# Patient Record
Sex: Female | Born: 1960 | Race: White | Hispanic: No | Marital: Married | Smoking: Former smoker
Health system: Southern US, Community
[De-identification: ages and names within clinical notes are randomized; demographics above are authoritative.]

## PROBLEM LIST (undated history)

## (undated) DIAGNOSIS — R519 Headache, unspecified: Secondary | ICD-10-CM

## (undated) DIAGNOSIS — R51 Headache: Secondary | ICD-10-CM

## (undated) DIAGNOSIS — M1990 Unspecified osteoarthritis, unspecified site: Secondary | ICD-10-CM

## (undated) DIAGNOSIS — J45909 Unspecified asthma, uncomplicated: Secondary | ICD-10-CM

## (undated) DIAGNOSIS — G709 Myoneural disorder, unspecified: Secondary | ICD-10-CM

## (undated) DIAGNOSIS — Z8619 Personal history of other infectious and parasitic diseases: Secondary | ICD-10-CM

## (undated) HISTORY — DX: Unspecified osteoarthritis, unspecified site: M19.90

## (undated) HISTORY — DX: Headache: R51

## (undated) HISTORY — DX: Myoneural disorder, unspecified: G70.9

## (undated) HISTORY — DX: Unspecified asthma, uncomplicated: J45.909

## (undated) HISTORY — DX: Personal history of other infectious and parasitic diseases: Z86.19

## (undated) HISTORY — DX: Headache, unspecified: R51.9

---

## 2003-12-22 ENCOUNTER — Encounter: Admission: RE | Admit: 2003-12-22 | Discharge: 2003-12-22 | Payer: Self-pay | Admitting: Internal Medicine

## 2004-03-23 ENCOUNTER — Other Ambulatory Visit: Admission: RE | Admit: 2004-03-23 | Discharge: 2004-03-23 | Payer: Self-pay | Admitting: Internal Medicine

## 2011-10-17 ENCOUNTER — Other Ambulatory Visit: Payer: Self-pay | Admitting: Obstetrics and Gynecology

## 2011-10-17 DIAGNOSIS — R928 Other abnormal and inconclusive findings on diagnostic imaging of breast: Secondary | ICD-10-CM

## 2011-10-25 ENCOUNTER — Ambulatory Visit
Admission: RE | Admit: 2011-10-25 | Discharge: 2011-10-25 | Disposition: A | Discharge status: Home / Self Care | Payer: BC Managed Care – PPO | Source: Ambulatory Visit | Attending: Obstetrics and Gynecology | Admitting: Obstetrics and Gynecology

## 2011-10-25 DIAGNOSIS — R928 Other abnormal and inconclusive findings on diagnostic imaging of breast: Secondary | ICD-10-CM

## 2012-09-28 DIAGNOSIS — J4520 Mild intermittent asthma, uncomplicated: Secondary | ICD-10-CM | POA: Insufficient documentation

## 2012-09-28 DIAGNOSIS — F17200 Nicotine dependence, unspecified, uncomplicated: Secondary | ICD-10-CM | POA: Insufficient documentation

## 2014-11-23 LAB — HM MAMMOGRAPHY

## 2014-11-23 LAB — HM COLONOSCOPY

## 2014-11-23 LAB — HM PAP SMEAR: HM Pap smear: NORMAL

## 2015-01-23 LAB — HM COLONOSCOPY

## 2015-04-06 ENCOUNTER — Encounter (HOSPITAL_COMMUNITY): Payer: Self-pay | Admitting: Emergency Medicine

## 2015-04-06 ENCOUNTER — Emergency Department (HOSPITAL_COMMUNITY)
Admission: EM | Admit: 2015-04-06 | Discharge: 2015-04-07 | Disposition: A | Discharge status: Home / Self Care | Payer: BLUE CROSS/BLUE SHIELD | Attending: Physician Assistant | Admitting: Physician Assistant

## 2015-04-06 ENCOUNTER — Emergency Department (HOSPITAL_COMMUNITY): Payer: BLUE CROSS/BLUE SHIELD

## 2015-04-06 DIAGNOSIS — S8991XA Unspecified injury of right lower leg, initial encounter: Secondary | ICD-10-CM | POA: Diagnosis not present

## 2015-04-06 DIAGNOSIS — Y9389 Activity, other specified: Secondary | ICD-10-CM | POA: Diagnosis not present

## 2015-04-06 DIAGNOSIS — S199XXA Unspecified injury of neck, initial encounter: Secondary | ICD-10-CM | POA: Insufficient documentation

## 2015-04-06 DIAGNOSIS — S80812A Abrasion, left lower leg, initial encounter: Secondary | ICD-10-CM | POA: Diagnosis not present

## 2015-04-06 DIAGNOSIS — Y92410 Unspecified street and highway as the place of occurrence of the external cause: Secondary | ICD-10-CM | POA: Diagnosis not present

## 2015-04-06 DIAGNOSIS — Z79899 Other long term (current) drug therapy: Secondary | ICD-10-CM | POA: Insufficient documentation

## 2015-04-06 DIAGNOSIS — Y998 Other external cause status: Secondary | ICD-10-CM | POA: Diagnosis not present

## 2015-04-06 DIAGNOSIS — Z87891 Personal history of nicotine dependence: Secondary | ICD-10-CM | POA: Insufficient documentation
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
## 2015-04-07 ENCOUNTER — Emergency Department (HOSPITAL_COMMUNITY): Payer: BLUE CROSS/BLUE SHIELD

## 2015-04-07 DIAGNOSIS — S199XXA Unspecified injury of neck, initial encounter: Secondary | ICD-10-CM | POA: Diagnosis not present

## 2015-04-07 MED ORDER — TETANUS-DIPHTH-ACELL PERTUSSIS 5-2.5-18.5 LF-MCG/0.5 IM SUSP
0.5000 mL | Freq: Once | INTRAMUSCULAR | Status: DC
  Filled 2015-04-07: qty 0.5

## 2015-04-07 MED ORDER — IBUPROFEN 800 MG PO TABS: 800.0000 mg | ORAL_TABLET | Freq: Three times a day (TID) | ORAL | Status: DC

## 2015-04-07 MED ORDER — CYCLOBENZAPRINE HCL 10 MG PO TABS: 10.0000 mg | ORAL_TABLET | Freq: Two times a day (BID) | ORAL | Status: DC | PRN

## 2015-04-07 NOTE — ED Notes (Signed)
Pt going back to xray

## 2015-04-07 NOTE — Discharge Instructions (Signed)

## 2015-04-14 ENCOUNTER — Telehealth: Payer: Self-pay | Admitting: General Practice

## 2015-04-14 NOTE — Telephone Encounter (Signed)
Caller name:Quintin, Ayasha Egusquiza Relation to [id number] Call back [phone] Pharmacy:  Reason for call:  Patient would like to establish care when PCP relocates to [facility]. Please advise

## 2015-04-15 NOTE — Telephone Encounter (Signed)
Called patient back, no voicemail setup

## 2015-04-15 NOTE — Telephone Encounter (Signed)
New patient appointment scheduled for 05/05/2015

## 2015-04-15 NOTE — Telephone Encounter (Signed)
Ok to establish 

## 2015-04-20 ENCOUNTER — Encounter: Payer: Self-pay | Admitting: Family Medicine

## 2015-04-20 ENCOUNTER — Ambulatory Visit (HOSPITAL_BASED_OUTPATIENT_CLINIC_OR_DEPARTMENT_OTHER)
Admission: RE | Admit: 2015-04-20 | Discharge: 2015-04-20 | Disposition: A | Discharge status: Home / Self Care | Payer: BLUE CROSS/BLUE SHIELD | Source: Ambulatory Visit | Attending: Family Medicine | Admitting: Family Medicine

## 2015-04-20 ENCOUNTER — Ambulatory Visit (INDEPENDENT_AMBULATORY_CARE_PROVIDER_SITE_OTHER): Payer: BLUE CROSS/BLUE SHIELD | Admitting: Family Medicine

## 2015-04-20 VITALS — BP 124/84 | HR 77 | Temp 98.0°F | Resp 16 | Ht 67.5 in | Wt 263.0 lb

## 2015-04-20 DIAGNOSIS — M79661 Pain in right lower leg: Secondary | ICD-10-CM

## 2015-04-20 DIAGNOSIS — M79604 Pain in right leg: Secondary | ICD-10-CM | POA: Insufficient documentation

## 2015-04-20 DIAGNOSIS — M25521 Pain in right elbow: Secondary | ICD-10-CM

## 2015-04-20 DIAGNOSIS — R2241 Localized swelling, mass and lump, right lower limb: Secondary | ICD-10-CM | POA: Insufficient documentation

## 2015-04-20 DIAGNOSIS — M6248 Contracture of muscle, other site: Secondary | ICD-10-CM

## 2015-04-20 DIAGNOSIS — M62838 Other muscle spasm: Secondary | ICD-10-CM

## 2015-04-20 DIAGNOSIS — M7989 Other specified soft tissue disorders: Secondary | ICD-10-CM

## 2015-04-20 NOTE — Progress Notes (Signed)
Pre visit review using our clinic review tool, if applicable. No additional management support is needed unless otherwise documented below in the visit note. 

## 2015-04-20 NOTE — Patient Instructions (Signed)
Follow up as scheduled Go downstairs and get your ultrasound done at 1:30 Start the Mobic once daily- take w/ food for inflammation Use the flexeril at night for muscle spasm, will cause drowsiness Heat the neck and shoulders Ice the elbow Heat the lower leg Call with any questions or concerns Hang in there!!!

## 2015-04-20 NOTE — Assessment & Plan Note (Signed)
New.  Start scheduled NSAIDs, flexeril prn.  Heat.  Discussed need for stretching and/or gentle massage.  Will follow.

## 2015-04-20 NOTE — Assessment & Plan Note (Signed)
New.  Get [place] to r/o DVT vs hematoma (more likely).  Start scheduled NSAIDs, heat, gentle massage.  If doppler is + for DVT, plan is to start Xarelto.  Reviewed supportive care and red flags that should prompt return.  Pt expressed understanding and is in agreement w/ plan.
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
## 2015-04-23 ENCOUNTER — Telehealth: Payer: Self-pay | Admitting: Family Medicine

## 2015-04-23 MED ORDER — MELOXICAM 15 MG PO TABS: 15.0000 mg | ORAL_TABLET | Freq: Every day | ORAL | Status: DC

## 2015-04-23 NOTE — Telephone Encounter (Signed)
Medication filled to pharmacy as requested.   

## 2015-04-23 NOTE — Telephone Encounter (Signed)
Caller name: Self   Can be reached: [phone]  Pharmacy:  [id number] - [place], [place] AT [facility] OF [address] [phone] (Phone) [phone] (Fax)         Reason for call: States Dr.Tabori was to send her a rx to pharmacy for Mobic but it was not sent

## 2015-05-04 ENCOUNTER — Telehealth: Payer: Self-pay | Admitting: Behavioral Health

## 2015-05-04 ENCOUNTER — Encounter: Payer: Self-pay | Admitting: Behavioral Health

## 2015-05-04 NOTE — Telephone Encounter (Signed)
Pre-Visit Call completed with patient and chart updated.   Pre-Visit Info documented in Specialty Comments under [employer].    

## 2015-05-05 ENCOUNTER — Encounter: Payer: Self-pay | Admitting: Family Medicine

## 2015-05-05 ENCOUNTER — Ambulatory Visit (INDEPENDENT_AMBULATORY_CARE_PROVIDER_SITE_OTHER): Payer: BLUE CROSS/BLUE SHIELD | Admitting: Family Medicine

## 2015-05-05 ENCOUNTER — Encounter (INDEPENDENT_AMBULATORY_CARE_PROVIDER_SITE_OTHER): Payer: Self-pay

## 2015-05-05 VITALS — BP 112/51 | HR 75 | Temp 98.2°F | Ht 68.0 in | Wt 266.6 lb

## 2015-05-05 DIAGNOSIS — Z0000 Encounter for general adult medical examination without abnormal findings: Secondary | ICD-10-CM | POA: Diagnosis not present

## 2015-05-05 DIAGNOSIS — E669 Obesity, unspecified: Secondary | ICD-10-CM | POA: Insufficient documentation

## 2015-05-05 DIAGNOSIS — M179 Osteoarthritis of knee, unspecified: Secondary | ICD-10-CM | POA: Insufficient documentation

## 2015-05-05 DIAGNOSIS — M1710 Unilateral primary osteoarthritis, unspecified knee: Secondary | ICD-10-CM | POA: Insufficient documentation

## 2015-05-05 DIAGNOSIS — M170 Bilateral primary osteoarthritis of knee: Secondary | ICD-10-CM

## 2015-05-05 HISTORY — DX: Encounter for general adult medical examination without abnormal findings: Z00.00

## 2015-05-05 NOTE — Assessment & Plan Note (Signed)
Pt's PE WNL w/ exception of obesity.  UTD on GYN, colonoscopy.  UTD on flu shot.  Check labs.  Anticipatory guidance provided.

## 2015-05-05 NOTE — Progress Notes (Signed)
Pre visit review using our clinic review tool, if applicable. No additional management support is needed unless otherwise documented below in the visit note. 

## 2015-05-05 NOTE — Progress Notes (Signed)
   Subjective:    Patient ID: Patricia Adams, female    DOB: Aug 23, 1960, 55 y.o.   MRN: [id number]  HPI CPE- UTD on GYN w/ Dr Philis Pique, UTD on colonoscopy w/ Dr Collene Mares.   Review of Systems Patient reports no vision/ hearing changes, adenopathy,fever, weight change,  persistant/recurrent hoarseness , swallowing issues, chest pain, palpitations, edema, persistant/recurrent cough, hemoptysis, dyspnea (rest/exertional/paroxysmal nocturnal), gastrointestinal bleeding (melena, rectal bleeding), abdominal pain, significant heartburn, bowel changes, GU symptoms (dysuria, hematuria, incontinence), Gyn symptoms (abnormal  bleeding, pain),  syncope, focal weakness, memory loss, numbness & tingling, skin/hair/nail changes, abnormal bruising or bleeding, anxiety, or depression.   Bilateral knee pain- pt requesting ortho referral, tweaked knee while moving daughter into apartment and required knee injxn in [place], [place].    Objective:   Physical Exam General Appearance:    Alert, cooperative, no distress, appears stated age, obese  Head:    Normocephalic, without obvious abnormality, atraumatic  Eyes:    PERRL, conjunctiva/corneas clear, EOM's intact, fundi    benign, both eyes  Ears:    Normal TM's and external ear canals, both ears  Nose:   Nares normal, septum midline, mucosa normal, no drainage    or sinus tenderness  Throat:   Lips, mucosa, and tongue normal; teeth and gums normal  Neck:   Supple, symmetrical, trachea midline, no adenopathy;    Thyroid: no enlargement/tenderness/nodules  Back:     Symmetric, no curvature, ROM normal, no CVA tenderness  Lungs:     Clear to auscultation bilaterally, respirations unlabored  Chest Wall:    No tenderness or deformity   Heart:    Regular rate and rhythm, S1 and S2 normal, no murmur, rub   or gallop  Breast Exam:    Deferred to GYN  Abdomen:     Soft, non-tender, bowel sounds active all four quadrants,    no masses, no organomegaly  Genitalia:    Deferred  to GYN  Rectal:    Extremities:   Extremities normal, atraumatic, no cyanosis or edema  Pulses:   2+ and symmetric all extremities  Skin:   Skin color, texture, turgor normal, no rashes or lesions  Lymph nodes:   Cervical, supraclavicular, and axillary nodes normal  Neurologic:   CNII-XII intact, normal strength, sensation and reflexes    throughout          Assessment & Plan:

## 2015-05-05 NOTE — Assessment & Plan Note (Signed)
Refer to ortho at pt's request 

## 2015-05-05 NOTE — Patient Instructions (Signed)
Follow up in 1 year or as needed We'll notify you of your lab results and make any changes if needed Try and make healthy food choices and get regular exercise Call with any questions or concerns If you want to join [place] at the new [place] office, any scheduled appointments will automatically transfer and we will see you at [address] [place] [address], [place] [address] ([place]) Happy New Year!!

## 2015-05-05 NOTE — Assessment & Plan Note (Signed)
Stressed need for healthy diet and regular exercise.  Check labs to risk stratify.  Will continue to follow.

## 2015-05-06 LAB — LIPID PANEL
Cholesterol: 191 mg/dL (ref 0–200)
HDL: 60.4 mg/dL (ref >39.00)
NonHDL: 130.26
Total CHOL/HDL Ratio: 3
Triglycerides: 212 mg/dL — ABNORMAL HIGH (ref 0.0–149.0)
VLDL: 42.4 mg/dL — ABNORMAL HIGH (ref 0.0–40.0)

## 2015-05-06 LAB — HEPATIC FUNCTION PANEL
ALT: 17 U/L (ref 0–35)
AST: 16 U/L (ref 0–37)
Albumin: 4.3 g/dL (ref 3.5–5.2)
Alkaline Phosphatase: 98 U/L (ref 39–117)
Bilirubin, Direct: 0.1 mg/dL (ref 0.0–0.3)
Total Bilirubin: 0.3 mg/dL (ref 0.2–1.2)
Total Protein: 7.2 g/dL (ref 6.0–8.3)

## 2015-05-06 LAB — BASIC METABOLIC PANEL
BUN: 16 mg/dL (ref 6–23)
CO2: 29 mEq/L (ref 19–32)
Calcium: 9.9 mg/dL (ref 8.4–10.5)
Chloride: 105 mEq/L (ref 96–112)
Creatinine, Ser: 1.05 mg/dL (ref 0.40–1.20)
GFR: 58.18 mL/min — ABNORMAL LOW (ref >60.00)
Glucose, Bld: 90 mg/dL (ref 70–99)
Potassium: 4.8 mEq/L (ref 3.5–5.1)
Sodium: 141 mEq/L (ref 135–145)

## 2015-05-06 LAB — CBC WITH DIFFERENTIAL/PLATELET
Basophils Absolute: 0 10*3/uL (ref 0.0–0.1)
Basophils Relative: 0.7 % (ref 0.0–3.0)
Eosinophils Absolute: 0.1 10*3/uL (ref 0.0–0.7)
Eosinophils Relative: 1.5 % (ref 0.0–5.0)
HCT: 37.2 % (ref 36.0–46.0)
Hemoglobin: 12.2 g/dL (ref 12.0–15.0)
Lymphocytes Relative: 29.1 % (ref 12.0–46.0)
Lymphs Abs: 1.8 10*3/uL (ref 0.7–4.0)
MCHC: 32.8 g/dL (ref 30.0–36.0)
MCV: 85.8 fl (ref 78.0–100.0)
Monocytes Absolute: 0.7 10*3/uL (ref 0.1–1.0)
Monocytes Relative: 11.7 % (ref 3.0–12.0)
Neutro Abs: 3.4 10*3/uL (ref 1.4–7.7)
Neutrophils Relative %: 57 % (ref 43.0–77.0)
Platelets: 300 10*3/uL (ref 150.0–400.0)
RBC: 4.34 Mil/uL (ref 3.87–5.11)
RDW: 15.3 % (ref 11.5–15.5)
WBC: 6 10*3/uL (ref 4.0–10.5)

## 2015-05-06 LAB — VITAMIN D 25 HYDROXY (VIT D DEFICIENCY, FRACTURES): VITD: 25.73 ng/mL — ABNORMAL LOW (ref 30.00–100.00)

## 2015-05-06 LAB — LDL CHOLESTEROL, DIRECT: Direct LDL: 49 mg/dL

## 2015-05-06 LAB — TSH: TSH: 2.75 u[IU]/mL (ref 0.35–4.50)

## 2015-05-06 LAB — HEMOGLOBIN A1C: Hgb A1c MFr Bld: 5.7 % (ref 4.6–6.5)

## 2015-05-11 ENCOUNTER — Other Ambulatory Visit: Payer: Self-pay | Admitting: Family Medicine

## 2015-05-11 MED ORDER — VITAMIN D (ERGOCALCIFEROL) 1.25 MG (50000 UNIT) PO CAPS: 50000.0000 [IU] | ORAL_CAPSULE | ORAL | Status: DC

## 2015-06-15 ENCOUNTER — Telehealth: Payer: Self-pay | Admitting: Family Medicine
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
## 2015-06-15 NOTE — Telephone Encounter (Signed)
[place] for PT referral to [facility] location- but we need to know if it is her knee or elbow or back that is causing her current pain so we can refer for appropriate dx

## 2015-06-16 ENCOUNTER — Other Ambulatory Visit: Payer: Self-pay | Admitting: Family Medicine

## 2015-06-16 DIAGNOSIS — M25521 Pain in right elbow: Secondary | ICD-10-CM
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
## 2015-06-29 ENCOUNTER — Ambulatory Visit: Payer: BLUE CROSS/BLUE SHIELD | Attending: Family Medicine | Admitting: Physical Therapy

## 2015-06-29 DIAGNOSIS — M7701 Medial epicondylitis, right elbow: Secondary | ICD-10-CM | POA: Diagnosis present

## 2015-06-29 DIAGNOSIS — M25521 Pain in right elbow: Secondary | ICD-10-CM | POA: Diagnosis not present

## 2015-06-29 DIAGNOSIS — M25631 Stiffness of right wrist, not elsewhere classified: Secondary | ICD-10-CM | POA: Insufficient documentation

## 2015-06-29 NOTE — Patient Instructions (Signed)
Wrist: Flexion   Rest arm with elbow on padded surface. Let wrist drop down. Apply gentle downward push with fingers of other hand. Hold __20__ seconds. Repeat __5__ times. Do _2___ sessions per day. CAUTION: Stretch slowly and gently. Do not force joint. Activity: Rest chin on back of hand with elbow on firm surface.*    Wrist Extension: Lowering (Eccentric), Assist - Elbow Extended   Arm on table, elbow straight, palm down. Use other hand to lift affected hand, bending wrist up. Slowly lower hand for 3-5 seconds. _10__ reps per set, _2__ sets, _2_ times per day.   Composite Extension (Passive Flexor Stretch)   Sitting with elbows on table and palms together, slowly lower wrists toward table until stretch is felt. Be sure to keep palms together throughout stretch. Hold _20___ seconds. Relax. Repeat _5___ times. Do __2__ sessions per day.  
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
## 2015-07-07 ENCOUNTER — Encounter: Payer: Self-pay | Admitting: Physical Therapy

## 2015-07-07 ENCOUNTER — Ambulatory Visit: Payer: BLUE CROSS/BLUE SHIELD | Admitting: Physical Therapy

## 2015-07-07 DIAGNOSIS — M25631 Stiffness of right wrist, not elsewhere classified: Secondary | ICD-10-CM

## 2015-07-07 DIAGNOSIS — M25521 Pain in right elbow: Secondary | ICD-10-CM

## 2015-07-07 DIAGNOSIS — M7701 Medial epicondylitis, right elbow: Secondary | ICD-10-CM

## 2015-07-07 NOTE — Therapy (Signed)
[place], [place], [address] Phone: [phone]   Fax:  [phone]  Physical Therapy Treatment  Patient Details  Name: Patricia Adams MRN: [id number] Date of Birth: Apr 14, 1961 Referring Provider: Birdie Riddle  Encounter Date: 07/07/2015      PT End of Session - 07/07/15 0830    Visit Number 2   Date for PT Re-Evaluation 08/29/15   PT Start Time 0806   PT Stop Time 0849   PT Time Calculation (min) 43 min   Activity Tolerance Patient tolerated treatment well   Behavior During Therapy [facility] for tasks assessed/performed      Past Medical History  Diagnosis Date  . Headache   . Arthritis   . History of chicken pox   . Asthma     Situational asthma  . Neuromuscular disorder ([place])     History reviewed. No pertinent past surgical history.  There were no vitals filed for this visit.  Visit Diagnosis:  Right elbow pain  Wrist stiffness, right  Medial epicondylitis, right      Subjective Assessment - 07/07/15 0827    Subjective Patient reports that she has not been godd with doing her exercises at work.   Currently in Pain? Yes   Pain Score 3    Pain Location Elbow   Pain Orientation Right;Medial   Pain Descriptors / Indicators Aching;Pressure   Aggravating Factors  work, driving   Pain Relieving Factors rest                         [facility] Adult PT Treatment/Exercise - 07/07/15 0001    Modalities   Modalities Moist Heat;Electrical Stimulation;Ultrasound   Moist Heat Therapy   Number Minutes Moist Heat 15 Minutes   Moist Heat Location Elbow   Electrical Stimulation   Electrical Stimulation Location right medial elbow   Electrical Stimulation Action IFC   Electrical Stimulation Goals Pain   Ultrasound   Ultrasound Location right medial elbow   Ultrasound Parameters 1MHz   Ultrasound Goals Pain   Iontophoresis   Type of Iontophoresis Dexamethasone   Location  right medial elbow   Dose 65m   Time 4 hour patch   Manual Therapy   Manual Therapy Passive ROM;Soft tissue mobilization   Soft tissue mobilization to the wrist flexor mm bellies   Passive ROM forearm flexion and extension stretches                  PT Short Term Goals - 07/07/15 0831    PT SHORT TERM GOAL #1   Title independent with initial HEP   Status Partially Met           PT Long Term Goals - 06/29/15 1001    PT LONG TERM GOAL #1   Title report no pain iwth doing her hair   Time 8   Period Weeks   Status New   PT LONG TERM GOAL #2   Title report no issues with driving 1 hour   Time 8   Period Weeks   Status New   PT LONG TERM GOAL #3   Title increase right wrist AROM to WNL's   Time 8   Period Weeks   Status New   PT LONG TERM GOAL #4   Title be able to do yoga at work without pain   Time 8   Period Weeks   Status New  Plan - 07/07/15 0830    Clinical Impression Statement Patient with pain medial right elbow, tight and tender with golfer's elbow testing   PT Next Visit Plan really encouraged her to do the stretches at work   [employer] and Agree with Plan of Care Patient        Problem List Patient Active Problem List   Diagnosis Date Noted  . Physical exam 05/05/2015  . Severe obesity (BMI >= 40) ([place]) 05/05/2015  . Arthritis of knee, degenerative 05/05/2015  . Pain and swelling of right lower leg 04/20/2015  . Trapezius muscle spasm 04/20/2015  . Right elbow pain 04/20/2015    Sumner Boast., PT 07/07/2015, 8:33 AM  [place] [address] [place], [place], [address] Phone: [phone]   Fax:  [phone]  Name: Patricia Adams MRN: [id number] Date of Birth: 25-Sep-1960

## 2015-07-14 ENCOUNTER — Ambulatory Visit: Payer: BLUE CROSS/BLUE SHIELD | Admitting: Physical Therapy

## 2015-07-14 ENCOUNTER — Encounter: Payer: Self-pay | Admitting: Physical Therapy

## 2015-07-14 DIAGNOSIS — M25521 Pain in right elbow: Secondary | ICD-10-CM | POA: Diagnosis not present

## 2015-07-14 DIAGNOSIS — M7701 Medial epicondylitis, right elbow: Secondary | ICD-10-CM

## 2015-07-14 DIAGNOSIS — M25631 Stiffness of right wrist, not elsewhere classified: Secondary | ICD-10-CM

## 2015-07-14 NOTE — Therapy (Signed)
[place], [place], [address] Phone: [phone]   Fax:  [phone]  Physical Therapy Treatment  Patient Details  Name: Patricia Adams MRN: [id number] Date of Birth: 30-Mar-1961 Referring Provider: Birdie Riddle  Encounter Date: 07/14/2015      PT End of Session - 07/14/15 0826    Visit Number 3   Date for PT Re-Evaluation 08/29/15   PT Start Time 0804   PT Stop Time 0859   PT Time Calculation (min) 55 min   Activity Tolerance Patient tolerated treatment well   Behavior During Therapy [facility] for tasks assessed/performed      Past Medical History  Diagnosis Date  . Headache   . Arthritis   . History of chicken pox   . Asthma     Situational asthma  . Neuromuscular disorder ([place])     History reviewed. No pertinent past surgical history.  There were no vitals filed for this visit.  Visit Diagnosis:  Right elbow pain  Wrist stiffness, right  Medial epicondylitis, right      Subjective Assessment - 07/14/15 0804    Subjective Maybe a little better but I still feel it with even opening the door I get a twang.   Currently in Pain? Yes   Pain Score 2    Pain Location Elbow   Pain Orientation Right;Medial   Aggravating Factors  opening door a 5/10, difficulty sleeping at night aches   Pain Relieving Factors rest            [facility] PT Assessment - 07/14/15 0001    AROM   Right Wrist Extension 60 Degrees   Right Wrist Flexion 60 Degrees                     [facility] Adult PT Treatment/Exercise - 07/14/15 0001    Moist Heat Therapy   Number Minutes Moist Heat 15 Minutes   Moist Heat Location Elbow   Electrical Stimulation   Electrical Stimulation Location right medial elbow   Electrical Stimulation Action IFC   Electrical Stimulation Goals Pain   Ultrasound   Ultrasound Location right medial elbow   Ultrasound Parameters 1MHz   Ultrasound Goals Pain   Iontophoresis   Type of Iontophoresis Dexamethasone   Location right medial elbow   Dose 63mA   Time 4 hour patch   Manual Therapy   Manual Therapy Passive ROM;Soft tissue mobilization   Manual therapy comments K-tape to the right medial elbow for support of the mms and tendons   Soft tissue mobilization to the wrist flexor mm bellies   Passive ROM forearm flexion and extension stretches                  PT Short Term Goals - 07/14/15 0827    PT SHORT TERM GOAL #1   Title independent with initial HEP   Status Achieved           PT Long Term Goals - 07/14/15 0827    PT LONG TERM GOAL #1   Title report no pain iwth doing her hair   Status On-going   PT LONG TERM GOAL #2   Title report no issues with driving 1 hour   Status On-going               Plan - 07/14/15 0827    Clinical Impression Statement Demonstrating increased ROM and function, doing exercises throughout the day, tried tape  today to decrease stress on elbow   PT Next Visit Plan see if tape helps   Consulted and Agree with Plan of Care Patient        Problem List Patient Active Problem List   Diagnosis Date Noted  . Physical exam 05/05/2015  . Severe obesity (BMI >= 40) ([place]) 05/05/2015  . Arthritis of knee, degenerative 05/05/2015  . Pain and swelling of right lower leg 04/20/2015  . Trapezius muscle spasm 04/20/2015  . Right elbow pain 04/20/2015    Sumner Boast., PT 07/14/2015, 8:29 AM  [facility] [address] [place], [place], [address] Phone: [phone]   Fax:  [phone]  Name: MARYPAT MIEDEMA MRN: [id number] Date of Birth: 1960/08/29

## 2015-07-23 ENCOUNTER — Ambulatory Visit: Payer: BLUE CROSS/BLUE SHIELD | Admitting: Physical Therapy

## 2015-07-23 ENCOUNTER — Encounter: Payer: Self-pay | Admitting: Physical Therapy

## 2015-07-23 DIAGNOSIS — M7701 Medial epicondylitis, right elbow: Secondary | ICD-10-CM

## 2015-07-23 DIAGNOSIS — M25631 Stiffness of right wrist, not elsewhere classified: Secondary | ICD-10-CM

## 2015-07-23 DIAGNOSIS — M25521 Pain in right elbow: Secondary | ICD-10-CM | POA: Diagnosis not present

## 2015-07-23 NOTE — Therapy (Signed)
[place], [place], [address] Phone: [phone]   Fax:  [phone]  Physical Therapy Treatment  Patient Details  Name: Patricia Adams MRN: [id number] Date of Birth: 10-09-1960 Referring Provider: Birdie Riddle  Encounter Date: 07/23/2015      PT End of Session - 07/23/15 0837    Visit Number 4   Date for PT Re-Evaluation 08/29/15   PT Start Time 0802   PT Stop Time [id number]   PT Time Calculation (min) 53 min   Activity Tolerance Patient tolerated treatment well   Behavior During Therapy [facility] for tasks assessed/performed      Past Medical History  Diagnosis Date  . Headache   . Arthritis   . History of chicken pox   . Asthma     Situational asthma  . Neuromuscular disorder ([place])     History reviewed. No pertinent past surgical history.  There were no vitals filed for this visit.  Visit Diagnosis:  Right elbow pain  Wrist stiffness, right  Medial epicondylitis, right      Subjective Assessment - 07/23/15 0805    Subjective I think the tape really helped.     Currently in Pain? Yes   Pain Score 1    Pain Location Elbow   Pain Orientation Right;Medial   Pain Descriptors / Indicators Aching                         [facility] Adult PT Treatment/Exercise - 07/23/15 0001    Exercises   Exercises Elbow   Elbow Exercises   Other elbow exercises velcro board for pro/supination exercises   Other elbow exercises 3# biceps, triceps   Electrical Stimulation   Electrical Stimulation Location right medial elbow   Electrical Stimulation Action IFC   Electrical Stimulation Goals Pain   Ultrasound   Ultrasound Location right medial elbow   Ultrasound Parameters 1MHz   Ultrasound Goals Pain   Manual Therapy   Manual Therapy Passive ROM;Soft tissue mobilization   Manual therapy comments K-tape to the right medial elbow for support of the mms and tendons   Soft tissue mobilization  to the wrist flexor mm bellies   Passive ROM forearm flexion and extension stretches                  PT Short Term Goals - 07/14/15 0827    PT SHORT TERM GOAL #1   Title independent with initial HEP   Status Achieved           PT Long Term Goals - 07/23/15 [id number]    PT LONG TERM GOAL #1   Title report no pain iwth doing her hair   Status Achieved   PT LONG TERM GOAL #2   Title report no issues with driving 1 hour   Status On-going               Plan - 07/23/15 [id number]    Clinical Impression Statement Patient reports that the tape really helped her.  having less pain with ADL's even opening doors.   PT Next Visit Plan may hold next week.  then resume with tape and ionto with needed as we add exercises   Consulted and Agree with Plan of Care Patient        Problem List Patient Active Problem List   Diagnosis Date Noted  . Physical exam 05/05/2015  . Severe obesity (BMI >= 40) (  [place]) 05/05/2015  . Arthritis of knee, degenerative 05/05/2015  . Pain and swelling of right lower leg 04/20/2015  . Trapezius muscle spasm 04/20/2015  . Right elbow pain 04/20/2015    Sumner Boast., PT 07/23/2015, 8:40 AM  [facility] [id number] [facility] [place], [place], [address] Phone: [phone]   Fax:  [phone]  Name: Patricia Adams MRN: [id number] Date of Birth: 13-Dec-1960

## 2015-08-05 ENCOUNTER — Ambulatory Visit: Payer: BLUE CROSS/BLUE SHIELD | Attending: Family Medicine | Admitting: Physical Therapy

## 2015-08-05 ENCOUNTER — Encounter: Payer: Self-pay | Admitting: Physical Therapy

## 2015-08-05 DIAGNOSIS — M25631 Stiffness of right wrist, not elsewhere classified: Secondary | ICD-10-CM | POA: Diagnosis not present

## 2015-08-05 DIAGNOSIS — M25521 Pain in right elbow: Secondary | ICD-10-CM | POA: Diagnosis not present

## 2015-08-05 NOTE — Therapy (Signed)
[place], [place], [address] Phone: [phone]   Fax:  [phone]  Physical Therapy Treatment  Patient Details  Name: Patricia Adams MRN: [id number] Date of Birth: April 03, 1961 Referring Provider: Birdie Riddle  Encounter Date: 08/05/2015      PT End of Session - 08/05/15 0829    Visit Number 5   Date for PT Re-Evaluation 09/04/15   PT Start Time 0800   PT Stop Time 0850   PT Time Calculation (min) 50 min   Activity Tolerance Patient tolerated treatment well   Behavior During Therapy [facility] for tasks assessed/performed      Past Medical History  Diagnosis Date  . Headache   . Arthritis   . History of chicken pox   . Asthma     Situational asthma  . Neuromuscular disorder ([place])     History reviewed. No pertinent past surgical history.  There were no vitals filed for this visit.      Subjective Assessment - 08/05/15 0827    Subjective Patient reports that the tape felt so good she did not take it off for over a week, she has a slight skin irritation today.  She reports over all doing better but pain after scrubbing to clean the past two days.   Currently in Pain? Yes   Pain Score 2    Pain Location Elbow   Pain Orientation Right;Medial   Pain Descriptors / Indicators Aching   Aggravating Factors  scrubbing   Pain Relieving Factors the tape really feels good            [facility] PT Assessment - 08/05/15 0001    AROM   Right Wrist Extension 65 Degrees   Right Wrist Flexion 65 Degrees   Strength   Overall Strength Comments right grip strength 60#                     [facility] Adult PT Treatment/Exercise - 08/05/15 0001    Moist Heat Therapy   Number Minutes Moist Heat 15 Minutes   Moist Heat Location Elbow   Electrical Stimulation   Electrical Stimulation Location right medial elbow   Electrical Stimulation Action IFC   Electrical Stimulation Goals Pain   Ultrasound   Ultrasound Location right medial elbow   Ultrasound Parameters 1MHz   Ultrasound Goals Pain   Iontophoresis   Type of Iontophoresis Dexamethasone   Location right medial elbow   Dose 51m   Time 4 hour patch   Manual Therapy   Soft tissue mobilization to the wrist flexor mm bellies   Passive ROM forearm flexion and extension stretches                  PT Short Term Goals - 07/14/15 0827    PT SHORT TERM GOAL #1   Title independent with initial HEP   Status Achieved           PT Long Term Goals - 08/05/15 07510   PT LONG TERM GOAL #1   Title report no pain iwth doing her hair   Status Achieved   PT LONG TERM GOAL #2   Title report no issues with driving 1 hour   Status Partially Met   PT LONG TERM GOAL #3   Title increase right wrist AROM to WNL's   Status Partially Met   PT LONG TERM GOAL #4   Title be able to do yoga at  work without pain   Status Partially Met             Patient will benefit from skilled therapeutic intervention in order to improve the following deficits and impairments:     Visit Diagnosis: Stiffness of right wrist, not elsewhere classified - Plan: PT plan of care cert/re-cert  Right elbow pain - Plan: PT plan of care cert/re-cert     Problem List Patient Active Problem List   Diagnosis Date Noted  . Physical exam 05/05/2015  . Severe obesity (BMI >= 40) ([place]) 05/05/2015  . Arthritis of knee, degenerative 05/05/2015  . Pain and swelling of right lower leg 04/20/2015  . Trapezius muscle spasm 04/20/2015  . Right elbow pain 04/20/2015    Sumner Boast., PT 08/05/2015, 8:40 AM  [facility] [address] [facility] [place], [place], [address] Phone: [phone]   Fax:  [phone]  Name: Patricia Adams MRN: [id number] Date of Birth: [birth year]

## 2015-08-13 ENCOUNTER — Ambulatory Visit: Payer: BLUE CROSS/BLUE SHIELD | Admitting: Physical Therapy

## 2015-08-13 ENCOUNTER — Encounter: Payer: Self-pay | Admitting: Physical Therapy

## 2015-08-13 DIAGNOSIS — M25521 Pain in right elbow: Secondary | ICD-10-CM

## 2015-08-13 DIAGNOSIS — M25631 Stiffness of right wrist, not elsewhere classified: Secondary | ICD-10-CM | POA: Diagnosis not present

## 2015-08-13 NOTE — Therapy (Signed)
[place], [place], [address] Phone: [phone]   Fax:  [phone]  Physical Therapy Treatment  Patient Details  Name: Patricia Adams MRN: [id number] Date of Birth: 05-Apr-1961 Referring Provider: Birdie Riddle  Encounter Date: 08/13/2015      PT End of Session - 08/13/15 0827    Visit Number 6   Date for PT Re-Evaluation 09/04/15   PT Start Time 0800   PT Stop Time 0848   PT Time Calculation (min) 48 min   Activity Tolerance Patient tolerated treatment well   Behavior During Therapy [facility] for tasks assessed/performed      Past Medical History  Diagnosis Date  . Headache   . Arthritis   . History of chicken pox   . Asthma     Situational asthma  . Neuromuscular disorder ([place])     History reviewed. No pertinent past surgical history.  There were no vitals filed for this visit.      Subjective Assessment - 08/13/15 0824    Subjective She continues to report that the elbow is feeling better with the tape.     Currently in Pain? Yes   Pain Score 1    Pain Location Elbow   Pain Orientation Right;Medial   Pain Descriptors / Indicators Aching   Aggravating Factors  cleaning   Pain Relieving Factors tape                         [facility] Adult PT Treatment/Exercise - 08/13/15 0001    Moist Heat Therapy   Number Minutes Moist Heat 15 Minutes   Moist Heat Location Elbow   Electrical Stimulation   Electrical Stimulation Location right medial elbow   Electrical Stimulation Action IFC   Electrical Stimulation Goals Pain   Ultrasound   Ultrasound Location right medial elbow   Ultrasound Parameters 1MHz 100% 1.4w/cm2   Ultrasound Goals Pain   Manual Therapy   Manual Therapy Passive ROM;Soft tissue mobilization   Manual therapy comments K-tape to the right medial elbow for support of the mms and tendons   Soft tissue mobilization to the wrist flexor mm bellies   Passive ROM  forearm flexion and extension stretches                  PT Short Term Goals - 07/14/15 0827    PT SHORT TERM GOAL #1   Title independent with initial HEP   Status Achieved           PT Long Term Goals - 08/13/15 0845    PT LONG TERM GOAL #1   Title report no pain iwth doing her hair   Status Achieved   PT LONG TERM GOAL #2   Title report no issues with driving 1 hour   Status Partially Met   PT LONG TERM GOAL #3   Title increase right wrist AROM to WNL's   Status Achieved   PT LONG TERM GOAL #4   Title be able to do yoga at work without pain   Status Partially Met               Plan - 08/13/15 0829    Clinical Impression Statement Doing better with the tape, she feels like she has been able to do more.   PT Next Visit Plan will hold next week and do one in two weeks   Consulted and Agree with Plan of  Care Patient      Patient will benefit from skilled therapeutic intervention in order to improve the following deficits and impairments:  Decreased range of motion, Increased muscle spasms, Impaired flexibility, Pain, Decreased strength  Visit Diagnosis: Right elbow pain  Stiffness of right wrist, not elsewhere classified     Problem List Patient Active Problem List   Diagnosis Date Noted  . Physical exam 05/05/2015  . Severe obesity (BMI >= 40) ([place]) 05/05/2015  . Arthritis of knee, degenerative 05/05/2015  . Pain and swelling of right lower leg 04/20/2015  . Trapezius muscle spasm 04/20/2015  . Right elbow pain 04/20/2015    Sumner Boast., PT 08/13/2015, 8:46 AM  [place] [address] [place], [place], [address] Phone: [phone]   Fax:  [phone]  Name: Patricia Adams MRN: [id number] Date of Birth: 12-May-1960

## 2015-08-24 ENCOUNTER — Encounter: Payer: BLUE CROSS/BLUE SHIELD | Admitting: Physical Therapy

## 2015-08-25 ENCOUNTER — Ambulatory Visit: Payer: BLUE CROSS/BLUE SHIELD | Attending: Family Medicine | Admitting: Physical Therapy

## 2015-08-25 ENCOUNTER — Encounter: Payer: Self-pay | Admitting: Physical Therapy

## 2015-08-25 DIAGNOSIS — M25521 Pain in right elbow: Secondary | ICD-10-CM | POA: Diagnosis not present

## 2015-08-25 DIAGNOSIS — M25631 Stiffness of right wrist, not elsewhere classified: Secondary | ICD-10-CM | POA: Diagnosis not present

## 2015-08-25 NOTE — Therapy (Signed)
[place], [place], [address] Phone: [phone]   Fax:  [phone]  Physical Therapy Treatment  Patient Details  Name: Patricia Adams MRN: [id number] Date of Birth: 1960-08-25 Referring Provider: Birdie Riddle  Encounter Date: 08/25/2015      PT End of Session - 08/25/15 0829    Visit Number 7   Date for PT Re-Evaluation 09/04/15   PT Start Time 0804   PT Stop Time [id number]   PT Time Calculation (min) 43 min   Activity Tolerance Patient tolerated treatment well   Behavior During Therapy [facility] for tasks assessed/performed      Past Medical History  Diagnosis Date  . Headache   . Arthritis   . History of chicken pox   . Asthma     Situational asthma  . Neuromuscular disorder ([place])     History reviewed. No pertinent past surgical history.  There were no vitals filed for this visit.      Subjective Assessment - 08/25/15 0827    Subjective Patient reports that she is feeling better, able to drive dress and do hair without pain.  She reports that she did yoga yesterday after a week off and has increased pain last night, just an ache.   Currently in Pain? Yes   Pain Score 2    Pain Location Elbow   Pain Orientation Right;Medial   Pain Descriptors / Indicators Aching                         [facility] Adult PT Treatment/Exercise - 08/25/15 0001    Moist Heat Therapy   Number Minutes Moist Heat 15 Minutes   Moist Heat Location Elbow   Electrical Stimulation   Electrical Stimulation Location right medial elbow   Electrical Stimulation Action IFC   Electrical Stimulation Goals Pain   Ultrasound   Ultrasound Location right medial elbow   Ultrasound Parameters 1MHz 1.5w/cm2   Ultrasound Goals Pain   Manual Therapy   Manual Therapy Passive ROM;Soft tissue mobilization   Manual therapy comments K-tape to the right medial elbow for support of the mms and tendons   Soft tissue  mobilization to the wrist flexor mm bellies   Passive ROM forearm flexion and extension stretches                  PT Short Term Goals - 07/14/15 0827    PT SHORT TERM GOAL #1   Title independent with initial HEP   Status Achieved           PT Long Term Goals - 08/25/15 0834    PT LONG TERM GOAL #2   Title report no issues with driving 1 hour   Status Achieved               Plan - 08/25/15 0830    Clinical Impression Statement Overall doing very good, less pain with ADL's, some pain after yoga.  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   PT Next Visit Plan Will hold again as she will be doing other things that may increase pain for the next few weeks   Consulted and Agree with Plan of Care Patient      Patient will benefit from skilled therapeutic intervention in order to improve the following deficits and impairments:  Decreased range of motion, Increased muscle spasms,  Impaired flexibility, Pain, Decreased strength  Visit Diagnosis: Right elbow pain     Problem List Patient Active Problem List   Diagnosis Date Noted  . Physical exam 05/05/2015  . Severe obesity (BMI >= 40) ([place]) 05/05/2015  . Arthritis of knee, degenerative 05/05/2015  . Pain and swelling of right lower leg 04/20/2015  . Trapezius muscle spasm 04/20/2015  . Right elbow pain 04/20/2015    Sumner Boast., PT 08/25/2015, 8:35 AM  [place] [id number] [facility] [place], [place], [address] Phone: [phone]   Fax:  [phone]  Name: SPARROW FEDAK MRN: [id number] Date of Birth: 1960/06/12

## 2015-09-08 ENCOUNTER — Ambulatory Visit: Payer: BLUE CROSS/BLUE SHIELD | Admitting: Physical Therapy

## 2015-09-08 ENCOUNTER — Encounter: Payer: Self-pay | Admitting: Physical Therapy

## 2015-09-08 DIAGNOSIS — M25631 Stiffness of right wrist, not elsewhere classified: Secondary | ICD-10-CM

## 2015-09-08 DIAGNOSIS — M25521 Pain in right elbow: Secondary | ICD-10-CM

## 2015-09-08 NOTE — Therapy (Signed)
[facility] [address] [place], [place], [address] Phone: [phone]   Fax:  [phone]  Physical Therapy Treatment  Patient Details  Name: Patricia Adams MRN: [id number] Date of Birth: 1960/11/06 Referring Provider: Beverely Low  Encounter Date: 09/08/2015      PT End of Session - 09/08/15 0827    Visit Number 8   Date for PT Re-Evaluation 09/04/15   PT Start Time 0806   PT Stop Time 0855   PT Time Calculation (min) 49 min   Activity Tolerance Patient tolerated treatment well   Behavior During Therapy [facility] for tasks assessed/performed      Past Medical History  Diagnosis Date  . Headache   . Arthritis   . History of chicken pox   . Asthma     Situational asthma  . Neuromuscular disorder (HCC)     History reviewed. No pertinent past surgical history.  There were no vitals filed for this visit.      Subjective Assessment - 09/08/15 0825    Subjective Patient reports that she was able to hand write 30 invitation with minimal discomfort, reports driving is without issue, has been able to return to Yoga   Currently in Pain? Yes   Pain Score 1    Pain Location Elbow   Pain Orientation Right;Medial   Aggravating Factors  a lot of work with the arm will cause some pain   Pain Relieving Factors the tape and the treatment                         [facility] Adult PT Treatment/Exercise - 09/08/15 0001    Electrical Stimulation   Electrical Stimulation Location right medial elbow   Electrical Stimulation Action IFC   Electrical Stimulation Goals Pain   Ultrasound   Ultrasound Location right medial elbow   Ultrasound Parameters 100% 1.3 w/cm2   Ultrasound Goals Pain   Manual Therapy   Manual Therapy Passive ROM;Soft tissue mobilization   Manual therapy comments K-tape to the right medial elbow for support of the mms and tendons   Soft tissue mobilization to the wrist flexor mm bellies    Passive ROM forearm flexion and extension stretches                  PT Short Term Goals - 07/14/15 0827    PT SHORT TERM GOAL #1   Title independent with initial HEP   Status Achieved           PT Long Term Goals - 09/08/15 0829    PT LONG TERM GOAL #1   Title report no pain iwth doing her hair   Status Achieved   PT LONG TERM GOAL #2   Title report no issues with driving 1 hour   Status Achieved   PT LONG TERM GOAL #3   Title increase right wrist AROM to WNL's   Status Achieved   PT LONG TERM GOAL #4   Title be able to do yoga at work without pain   Status Achieved               Plan - 09/08/15 0828    Clinical Impression Statement She has been able to do her normal routine without much increase of pain.  She has good ROM and strength without increase of pain   PT Next Visit Plan Will D/C with goals met   Consulted and Agree  with Plan of Care Patient      Patient will benefit from skilled therapeutic intervention in order to improve the following deficits and impairments:  Decreased range of motion, Increased muscle spasms, Impaired flexibility, Pain, Decreased strength  Visit Diagnosis: Right elbow pain  Stiffness of right wrist, not elsewhere classified     Problem List Patient Active Problem List   Diagnosis Date Noted  . Physical exam 05/05/2015  . Severe obesity (BMI >= 40) ([place]) 05/05/2015  . Arthritis of knee, degenerative 05/05/2015  . Pain and swelling of right lower leg 04/20/2015  . Trapezius muscle spasm 04/20/2015  . Right elbow pain 04/20/2015  PHYSICAL THERAPY DISCHARGE SUMMARY   Plan: Patient agrees to discharge.  Patient goals were met. Patient is being discharged due to meeting the stated rehab goals.  ?????       Sumner Boast., PT 09/08/2015, 8:31 AM  [facility] [address] [place], [place], [address] Phone: [phone]   Fax:   [phone]  Name: Patricia Adams MRN: [id number] Date of Birth: [birth year]

## 2016-01-20 DIAGNOSIS — Z01419 Encounter for gynecological examination (general) (routine) without abnormal findings: Secondary | ICD-10-CM | POA: Diagnosis not present

## 2016-01-20 DIAGNOSIS — Z1231 Encounter for screening mammogram for malignant neoplasm of breast: Secondary | ICD-10-CM | POA: Diagnosis not present

## 2016-01-20 DIAGNOSIS — Z6841 Body Mass Index (BMI) 40.0 and over, adult: Secondary | ICD-10-CM | POA: Diagnosis not present

## 2016-01-20 LAB — HM PAP SMEAR

## 2016-01-20 LAB — HM MAMMOGRAPHY: HM Mammogram: NORMAL (ref 0–4)

## 2016-01-24 ENCOUNTER — Encounter: Payer: Self-pay | Admitting: Family Medicine

## 2016-04-14 DIAGNOSIS — J40 Bronchitis, not specified as acute or chronic: Secondary | ICD-10-CM | POA: Diagnosis not present

## 2016-04-14 DIAGNOSIS — J0141 Acute recurrent pansinusitis: Secondary | ICD-10-CM | POA: Diagnosis not present

## 2016-04-14 DIAGNOSIS — J060 Acute laryngopharyngitis: Secondary | ICD-10-CM | POA: Diagnosis not present

## 2016-04-14 DIAGNOSIS — Z6841 Body Mass Index (BMI) 40.0 and over, adult: Secondary | ICD-10-CM | POA: Diagnosis not present
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
## 2016-06-12 DIAGNOSIS — Z6841 Body Mass Index (BMI) 40.0 and over, adult: Secondary | ICD-10-CM | POA: Diagnosis not present

## 2016-06-12 DIAGNOSIS — H66003 Acute suppurative otitis media without spontaneous rupture of ear drum, bilateral: Secondary | ICD-10-CM | POA: Diagnosis not present

## 2016-06-12 DIAGNOSIS — J0141 Acute recurrent pansinusitis: Secondary | ICD-10-CM | POA: Diagnosis not present

## 2016-06-12 DIAGNOSIS — J060 Acute laryngopharyngitis: Secondary | ICD-10-CM | POA: Diagnosis not present

## 2016-09-20 DIAGNOSIS — J209 Acute bronchitis, unspecified: Secondary | ICD-10-CM | POA: Diagnosis not present

## 2016-09-20 DIAGNOSIS — Z6841 Body Mass Index (BMI) 40.0 and over, adult: Secondary | ICD-10-CM | POA: Diagnosis not present

## 2016-09-22 ENCOUNTER — Encounter: Payer: BLUE CROSS/BLUE SHIELD | Admitting: Family Medicine

## 2016-09-29 DIAGNOSIS — J40 Bronchitis, not specified as acute or chronic: Secondary | ICD-10-CM | POA: Diagnosis not present

## 2016-09-29 DIAGNOSIS — Z6841 Body Mass Index (BMI) 40.0 and over, adult: Secondary | ICD-10-CM | POA: Diagnosis not present

## 2016-09-29 DIAGNOSIS — J0100 Acute maxillary sinusitis, unspecified: Secondary | ICD-10-CM | POA: Diagnosis not present

## 2016-11-30 ENCOUNTER — Encounter: Payer: Self-pay | Admitting: Family Medicine

## 2016-11-30 ENCOUNTER — Ambulatory Visit (INDEPENDENT_AMBULATORY_CARE_PROVIDER_SITE_OTHER): Payer: BLUE CROSS/BLUE SHIELD | Admitting: Family Medicine

## 2016-11-30 VITALS — BP 130/81 | HR 74 | Temp 98.1°F | Resp 16 | Ht 68.0 in | Wt 270.5 lb

## 2016-11-30 DIAGNOSIS — E559 Vitamin D deficiency, unspecified: Secondary | ICD-10-CM | POA: Diagnosis not present

## 2016-11-30 DIAGNOSIS — Z0000 Encounter for general adult medical examination without abnormal findings: Secondary | ICD-10-CM | POA: Diagnosis not present

## 2016-11-30 MED ORDER — MELOXICAM 15 MG PO TABS: 15.0000 mg | ORAL_TABLET | Freq: Every day | ORAL | 1 refills | Status: DC

## 2016-11-30 NOTE — Progress Notes (Signed)
Pre visit review using our clinic review tool, if applicable. No additional management support is needed unless otherwise documented below in the visit note. 

## 2016-11-30 NOTE — Assessment & Plan Note (Signed)
Pt has hx of this.  Check labs and replete prn. 

## 2016-11-30 NOTE — Assessment & Plan Note (Signed)
Pt continues to gain weight.  Stressed need for healthy diet and regular exercise.  Check labs to risk stratify.  Will follow.

## 2016-11-30 NOTE — Patient Instructions (Signed)
Follow up in 6 months to recheck weight loss progress We'll notify you of your lab results and make any changes if needed Continue to work on healthy diet and regular exercise- you can do it!! Start the Meloxicam once daily- w/ food- for pain/inflammation.  If no improvement in R arm pain, let me know and we'll refer you for further evaluation Work on posture to help w/ back pain Call with any questions or concerns Enjoy the rest of your summer!!!

## 2016-11-30 NOTE — Assessment & Plan Note (Signed)
Pt's PE WNL w/ exception of obesity.  UTD on colonoscopy, GYN.  Check labs.  Anticipatory guidance provided.

## 2016-11-30 NOTE — Progress Notes (Signed)
   Subjective:    Patient ID: Patricia Adams, female    DOB: March 23, 1961, 56 y.o.   MRN: [id number]  HPI CPE- UTD on colonoscopy, pap and mammo.  UTD on immunizations.     Review of Systems Patient reports no vision/ hearing changes, adenopathy,fever, weight change,  persistant/recurrent hoarseness, swallowing issues, chest pain, palpitations, edema, persistant/recurrent cough, hemoptysis, dyspnea (rest/exertional/paroxysmal nocturnal), gastrointestinal bleeding (melena, rectal bleeding), abdominal pain, significant heartburn, bowel changes, GU symptoms (dysuria, hematuria, incontinence), Gyn symptoms (abnormal  bleeding, pain),  syncope, focal weakness, memory loss, numbness & tingling, skin/hair/nail changes, abnormal bruising or bleeding, anxiety, or depression.   + LBP R biceps tendonitis    Objective:   Physical Exam General Appearance:    Alert, cooperative, no distress, appears stated age, obese  Head:    Normocephalic, without obvious abnormality, atraumatic  Eyes:    PERRL, conjunctiva/corneas clear, EOM's intact, fundi    benign, both eyes  Ears:    Normal TM's and external ear canals, both ears  Nose:   Nares normal, septum midline, mucosa normal, no drainage    or sinus tenderness  Throat:   Lips, mucosa, and tongue normal; teeth and gums normal  Neck:   Supple, symmetrical, trachea midline, no adenopathy;    Thyroid: no enlargement/tenderness/nodules  Back:     Symmetric, no curvature, ROM normal, no CVA tenderness  Lungs:     Clear to auscultation bilaterally, respirations unlabored  Chest Wall:    No tenderness or deformity   Heart:    Regular rate and rhythm, S1 and S2 normal, no murmur, rub   or gallop  Breast Exam:    Deferred to GYN  Abdomen:     Soft, non-tender, bowel sounds active all four quadrants,    no masses, no organomegaly  Genitalia:    Deferred to GYN  Rectal:    Extremities:   Extremities normal, atraumatic, no cyanosis or edema  Pulses:   2+ and  symmetric all extremities  Skin:   Skin color, texture, turgor normal, no rashes or lesions  Lymph nodes:   Cervical, supraclavicular, and axillary nodes normal  Neurologic:   CNII-XII intact, normal strength, sensation and reflexes    throughout         Assessment & Plan:

## 2016-12-01 ENCOUNTER — Other Ambulatory Visit: Payer: Self-pay | Admitting: General Practice

## 2016-12-01 LAB — CBC WITH DIFFERENTIAL/PLATELET
Basophils Absolute: 0 cells/uL (ref 0–200)
Basophils Relative: 0 %
Eosinophils Absolute: 112 cells/uL (ref 15–500)
Eosinophils Relative: 2 %
HCT: 36 % (ref 35.0–45.0)
Hemoglobin: 11.7 g/dL (ref 11.7–15.5)
Lymphocytes Relative: 30 %
Lymphs Abs: 1680 cells/uL (ref 850–3900)
MCH: 30.2 pg (ref 27.0–33.0)
MCHC: 32.5 g/dL (ref 32.0–36.0)
MCV: 92.8 fL (ref 80.0–100.0)
MPV: 8.9 fL (ref 7.5–12.5)
Monocytes Absolute: 560 cells/uL (ref 200–950)
Monocytes Relative: 10 %
Neutro Abs: 3248 cells/uL (ref 1500–7800)
Neutrophils Relative %: 58 %
Platelets: 325 10*3/uL (ref 140–400)
RBC: 3.88 MIL/uL (ref 3.80–5.10)
RDW: 14 % (ref 11.0–15.0)
WBC: 5.6 10*3/uL (ref 3.8–10.8)

## 2016-12-01 LAB — HEPATIC FUNCTION PANEL
ALT: 13 U/L (ref 6–29)
AST: 14 U/L (ref 10–35)
Albumin: 4.3 g/dL (ref 3.6–5.1)
Alkaline Phosphatase: 95 U/L (ref 33–130)
Bilirubin, Direct: 0.1 mg/dL (ref <=0.2)
Indirect Bilirubin: 0.2 mg/dL (ref 0.2–1.2)
Total Bilirubin: 0.3 mg/dL (ref 0.2–1.2)
Total Protein: 6.8 g/dL (ref 6.1–8.1)

## 2016-12-01 LAB — BASIC METABOLIC PANEL
BUN: 13 mg/dL (ref 7–25)
CO2: 22 mmol/L (ref 20–32)
Calcium: 9.5 mg/dL (ref 8.6–10.4)
Chloride: 105 mmol/L (ref 98–110)
Creat: 0.97 mg/dL (ref 0.50–1.05)
Glucose, Bld: 90 mg/dL (ref 65–99)
Potassium: 4 mmol/L (ref 3.5–5.3)
Sodium: 139 mmol/L (ref 135–146)

## 2016-12-01 LAB — HEMOGLOBIN A1C
Hgb A1c MFr Bld: 5 % (ref <5.7)
Mean Plasma Glucose: 97 mg/dL

## 2016-12-01 LAB — LIPID PANEL
Cholesterol: 176 mg/dL (ref <200)
HDL: 59 mg/dL (ref >50)
LDL Cholesterol: 74 mg/dL (ref <100)
Total CHOL/HDL Ratio: 3 Ratio (ref <5.0)
Triglycerides: 215 mg/dL — ABNORMAL HIGH (ref <150)
VLDL: 43 mg/dL — ABNORMAL HIGH (ref <30)

## 2016-12-01 LAB — VITAMIN D 25 HYDROXY (VIT D DEFICIENCY, FRACTURES): Vit D, 25-Hydroxy: 23 ng/mL — ABNORMAL LOW (ref 30–100)

## 2016-12-01 LAB — TSH: TSH: 2.21 mIU/L

## 2016-12-01 MED ORDER — VITAMIN D (ERGOCALCIFEROL) 1.25 MG (50000 UNIT) PO CAPS: 50000.0000 [IU] | ORAL_CAPSULE | ORAL | 0 refills | Status: DC
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
## 2017-02-16 DIAGNOSIS — Z1231 Encounter for screening mammogram for malignant neoplasm of breast: Secondary | ICD-10-CM | POA: Diagnosis not present

## 2017-02-16 DIAGNOSIS — Z6841 Body Mass Index (BMI) 40.0 and over, adult: Secondary | ICD-10-CM | POA: Diagnosis not present

## 2017-02-16 DIAGNOSIS — Z01419 Encounter for gynecological examination (general) (routine) without abnormal findings: Secondary | ICD-10-CM | POA: Diagnosis not present

## 2017-02-16 LAB — HM MAMMOGRAPHY

## 2017-02-22 ENCOUNTER — Encounter: Payer: Self-pay | Admitting: General Practice

## 2017-04-13 DIAGNOSIS — H6592 Unspecified nonsuppurative otitis media, left ear: Secondary | ICD-10-CM | POA: Diagnosis not present

## 2017-04-13 DIAGNOSIS — J209 Acute bronchitis, unspecified: Secondary | ICD-10-CM | POA: Diagnosis not present

## 2017-04-13 DIAGNOSIS — J329 Chronic sinusitis, unspecified: Secondary | ICD-10-CM | POA: Diagnosis not present

## 2017-06-04 ENCOUNTER — Ambulatory Visit: Payer: BLUE CROSS/BLUE SHIELD | Admitting: Family Medicine

## 2017-06-04 ENCOUNTER — Encounter: Payer: Self-pay | Admitting: General Practice

## 2017-06-04 ENCOUNTER — Encounter: Payer: Self-pay | Admitting: Family Medicine

## 2017-06-04 ENCOUNTER — Other Ambulatory Visit: Payer: Self-pay

## 2017-06-04 VITALS — BP 128/80 | HR 65 | Temp 98.0°F | Resp 16 | Ht 68.0 in | Wt 245.2 lb

## 2017-06-04 DIAGNOSIS — E559 Vitamin D deficiency, unspecified: Secondary | ICD-10-CM | POA: Diagnosis not present

## 2017-06-04 DIAGNOSIS — M25512 Pain in left shoulder: Secondary | ICD-10-CM | POA: Diagnosis not present

## 2017-06-04 DIAGNOSIS — E669 Obesity, unspecified: Secondary | ICD-10-CM | POA: Diagnosis not present

## 2017-06-04 DIAGNOSIS — Z8249 Family history of ischemic heart disease and other diseases of the circulatory system: Secondary | ICD-10-CM

## 2017-06-04 LAB — HEPATIC FUNCTION PANEL
ALT: 12 U/L (ref 0–35)
AST: 13 U/L (ref 0–37)
Albumin: 4.1 g/dL (ref 3.5–5.2)
Alkaline Phosphatase: 81 U/L (ref 39–117)
Bilirubin, Direct: 0.1 mg/dL (ref 0.0–0.3)
Total Bilirubin: 0.5 mg/dL (ref 0.2–1.2)
Total Protein: 6.8 g/dL (ref 6.0–8.3)

## 2017-06-04 LAB — CBC WITH DIFFERENTIAL/PLATELET
Basophils Absolute: 10 cells/uL (ref 0–200)
Basophils Relative: 0.2 %
Eosinophils Absolute: 60 cells/uL (ref 15–500)
Eosinophils Relative: 1.2 %
HCT: 36 % (ref 35.0–45.0)
Hemoglobin: 12 g/dL (ref 11.7–15.5)
Lymphs Abs: 1485 cells/uL (ref 850–3900)
MCH: 27.8 pg (ref 27.0–33.0)
MCHC: 33.3 g/dL (ref 32.0–36.0)
MCV: 83.3 fL (ref 80.0–100.0)
MPV: 10.1 fL (ref 7.5–12.5)
Monocytes Relative: 12.5 %
Neutro Abs: 2820 cells/uL (ref 1500–7800)
Neutrophils Relative %: 56.4 %
Platelets: 284 10*3/uL (ref 140–400)
RBC: 4.32 10*6/uL (ref 3.80–5.10)
RDW: 13.8 % (ref 11.0–15.0)
Total Lymphocyte: 29.7 %
WBC mixed population: 625 cells/uL (ref 200–950)
WBC: 5 10*3/uL (ref 3.8–10.8)

## 2017-06-04 LAB — LIPID PANEL
Cholesterol: 152 mg/dL (ref 0–200)
HDL: 64.9 mg/dL (ref >39.00)
LDL Cholesterol: 65 mg/dL (ref 0–99)
NonHDL: 87.22
Total CHOL/HDL Ratio: 2
Triglycerides: 112 mg/dL (ref 0.0–149.0)
VLDL: 22.4 mg/dL (ref 0.0–40.0)

## 2017-06-04 LAB — BASIC METABOLIC PANEL
BUN: 10 mg/dL (ref 6–23)
CO2: 29 mEq/L (ref 19–32)
Calcium: 9.3 mg/dL (ref 8.4–10.5)
Chloride: 104 mEq/L (ref 96–112)
Creatinine, Ser: 0.8 mg/dL (ref 0.40–1.20)
GFR: 79.02 mL/min (ref >60.00)
Glucose, Bld: 108 mg/dL — ABNORMAL HIGH (ref 70–99)
Potassium: 4.5 mEq/L (ref 3.5–5.1)
Sodium: 141 mEq/L (ref 135–145)

## 2017-06-04 LAB — TSH: TSH: 3.1 u[IU]/mL (ref 0.35–4.50)

## 2017-06-04 LAB — VITAMIN D 25 HYDROXY (VIT D DEFICIENCY, FRACTURES): VITD: 32.5 ng/mL (ref 30.00–100.00)

## 2017-06-04 NOTE — Patient Instructions (Signed)
Schedule your complete physical in 6 months We'll notify you of your lab results and make any changes if needed Continue to work on healthy diet and regular exercise- you're doing great!!! We'll call you with your stress test appt and the Orthopedic appt Call with any questions or concerns Happy Valentine's Day!!!

## 2017-06-04 NOTE — Progress Notes (Signed)
   Subjective:    Patient ID: Patricia Adams, female    DOB: 06/02/1960, 57 y.o.   MRN: [id number]  HPI Obesity- pt has lost 26 lbs since last visit, taking her BMI from 41 --> 37.29!  Pt is now eating '1/2 of what people hand me'.  Is grazing throughout the day.  Denies CP, SOB, HAs, visual changes, edema.  Vit D deficiency- due for repeat labs today.  Taking 2,000 units daily after completing 50,000 x12  L shoulder pain- pain w/ overhead motion, lifting is painful.  She reports arm feels weak.  sxs started ~2 months ago.  Unable to lie on L side due to pain.   Review of Systems For ROS see HPI     Objective:   Physical Exam  Constitutional: She is oriented to person, place, and time. She appears well-developed and well-nourished. No distress.  obese  HENT:  Head: Normocephalic and atraumatic.  Eyes: Conjunctivae and EOM are normal. Pupils are equal, round, and reactive to light.  Neck: Normal range of motion. Neck supple. No thyromegaly present.  Cardiovascular: Normal rate, regular rhythm, normal heart sounds and intact distal pulses.  No murmur heard. Pulmonary/Chest: Effort normal and breath sounds normal. No respiratory distress.  Abdominal: Soft. She exhibits no distension. There is no tenderness.  Musculoskeletal: She exhibits no edema.  Pain w/ overhead motion of L shoulder, pain w/ internal rotation  Lymphadenopathy:    She has no cervical adenopathy.  Neurological: She is alert and oriented to person, place, and time.  Skin: Skin is warm and dry.  Psychiatric: She has a normal mood and affect. Her behavior is normal.  Vitals reviewed.         Assessment & Plan:  L shoulder pain- new.  Pt is having pain and some subjective weakness.  Concern for rotator cuff issue.  Refer to ortho for complete evaluation and tx.  Pt expressed understanding and is in agreement w/ plan.

## 2017-06-04 NOTE — Assessment & Plan Note (Signed)
Mom had MI at 35.  Pt is obese and was a former smoker.  Get stress test to risk stratify.  Pt expressed understanding and is in agreement w/ plan.

## 2017-06-04 NOTE — Assessment & Plan Note (Signed)
Ongoing issue.  Pt has improved BMI from 41 to 37.29 by losing 26 lbs.  Applauded her efforts.  Check labs to risk stratify.  Will follow.

## 2017-06-04 NOTE — Assessment & Plan Note (Signed)
Check labs and replete prn. 

## 2017-06-05 ENCOUNTER — Encounter: Payer: Self-pay | Admitting: General Practice

## 2017-06-13 DIAGNOSIS — B070 Plantar wart: Secondary | ICD-10-CM | POA: Diagnosis not present

## 2017-06-13 DIAGNOSIS — N951 Menopausal and female climacteric states: Secondary | ICD-10-CM | POA: Insufficient documentation

## 2017-06-18 DIAGNOSIS — M7542 Impingement syndrome of left shoulder: Secondary | ICD-10-CM | POA: Diagnosis not present

## 2017-06-18 DIAGNOSIS — M25512 Pain in left shoulder: Secondary | ICD-10-CM | POA: Diagnosis not present

## 2017-06-25 DIAGNOSIS — B070 Plantar wart: Secondary | ICD-10-CM | POA: Diagnosis not present

## 2017-06-27 ENCOUNTER — Other Ambulatory Visit: Payer: Self-pay

## 2017-06-27 ENCOUNTER — Ambulatory Visit: Payer: BLUE CROSS/BLUE SHIELD | Admitting: Family Medicine

## 2017-06-27 ENCOUNTER — Encounter: Payer: Self-pay | Admitting: Family Medicine

## 2017-06-27 VITALS — BP 122/78 | HR 68 | Temp 98.0°F | Resp 16 | Ht 68.0 in | Wt 240.0 lb

## 2017-06-27 DIAGNOSIS — R197 Diarrhea, unspecified: Secondary | ICD-10-CM | POA: Diagnosis not present

## 2017-06-27 DIAGNOSIS — R7989 Other specified abnormal findings of blood chemistry: Secondary | ICD-10-CM

## 2017-06-27 DIAGNOSIS — R945 Abnormal results of liver function studies: Secondary | ICD-10-CM

## 2017-06-27 LAB — BASIC METABOLIC PANEL
BUN: 8 mg/dL (ref 6–23)
CO2: 29 mEq/L (ref 19–32)
Calcium: 9.6 mg/dL (ref 8.4–10.5)
Chloride: 106 mEq/L (ref 96–112)
Creatinine, Ser: 0.9 mg/dL (ref 0.40–1.20)
GFR: 68.96 mL/min (ref >60.00)
Glucose, Bld: 102 mg/dL — ABNORMAL HIGH (ref 70–99)
Potassium: 4 mEq/L (ref 3.5–5.1)
Sodium: 140 mEq/L (ref 135–145)

## 2017-06-27 LAB — HEPATIC FUNCTION PANEL
ALT: 67 U/L — ABNORMAL HIGH (ref 0–35)
AST: 24 U/L (ref 0–37)
Albumin: 3.9 g/dL (ref 3.5–5.2)
Alkaline Phosphatase: 77 U/L (ref 39–117)
Bilirubin, Direct: 0.1 mg/dL (ref 0.0–0.3)
Total Bilirubin: 0.4 mg/dL (ref 0.2–1.2)
Total Protein: 6.4 g/dL (ref 6.0–8.3)

## 2017-06-27 LAB — CBC WITH DIFFERENTIAL/PLATELET
Basophils Absolute: 0 10*3/uL (ref 0.0–0.1)
Basophils Relative: 0.5 % (ref 0.0–3.0)
Eosinophils Absolute: 0.1 10*3/uL (ref 0.0–0.7)
Eosinophils Relative: 1.3 % (ref 0.0–5.0)
HCT: 37.8 % (ref 36.0–46.0)
Hemoglobin: 12.5 g/dL (ref 12.0–15.0)
Lymphocytes Relative: 31.2 % (ref 12.0–46.0)
Lymphs Abs: 1.6 10*3/uL (ref 0.7–4.0)
MCHC: 33.2 g/dL (ref 30.0–36.0)
MCV: 84.4 fl (ref 78.0–100.0)
Monocytes Absolute: 0.7 10*3/uL (ref 0.1–1.0)
Monocytes Relative: 13.8 % — ABNORMAL HIGH (ref 3.0–12.0)
Neutro Abs: 2.8 10*3/uL (ref 1.4–7.7)
Neutrophils Relative %: 53.2 % (ref 43.0–77.0)
Platelets: 272 10*3/uL (ref 150.0–400.0)
RBC: 4.47 Mil/uL (ref 3.87–5.11)
RDW: 15.2 % (ref 11.5–15.5)
WBC: 5.2 10*3/uL (ref 4.0–10.5)

## 2017-06-27 NOTE — Patient Instructions (Signed)
Follow up as needed or as scheduled We'll notify you of your lab results and make any changes if needed Drink LOTS of fluids! Advance your diet slowly- add chicken, fruit, vegetables as able- but diary is last!! If symptoms change or worsen, let me know! Call with any questions or concerns Hang in there!!

## 2017-06-27 NOTE — Progress Notes (Signed)
   Subjective:    Patient ID: Patricia Adams, female    DOB: 07/02/1960, 57 y.o.   MRN: [id number]  HPI Diarrhea- pt reports she ate multiple salads 1 week ago and at 10:30 that night developed abdominal pain.  Overnight had diarrhea that kept her out of work Wednesday- Friday.  Friday evening ate a sandwich and 'Saturday I was back in the bathroom'.  Took Immodium and 'that constipated me'.  Yesterday was able to eat more regularly.  Continues to have abdominal cramping.  Mild nausea, no vomiting.  No fevers.  No one w/ similar sxs.  Pt denies current TTP.   Review of Systems For ROS see HPI     Objective:   Physical Exam  Constitutional: She is oriented to person, place, and time. She appears well-developed and well-nourished. No distress.  HENT:  Head: Normocephalic and atraumatic.  MMM  Neck: Neck supple.  Cardiovascular: Normal rate, regular rhythm and intact distal pulses.  Pulmonary/Chest: Effort normal and breath sounds normal. No respiratory distress. She has no wheezes. She has no rales.  Abdominal: Soft. Bowel sounds are normal. She exhibits no distension. There is no tenderness. There is no rebound.  Lymphadenopathy:    She has no cervical adenopathy.  Neurological: She is alert and oriented to person, place, and time.  Skin: Skin is warm and dry.  Vitals reviewed.         Assessment & Plan:  Diarrhea- new.  Pt's sxs seem to be resolving as she had a loose but otherwise normal BM yesterday.  Reassured pt of usually self limiting course- particularly in absence of red flags- blood, mucous, fever.  Check labs to r/o infxn or electrolyte abnormality.  Reviewed supportive care and red flags that should prompt return.  Pt expressed understanding and is in agreement w/ plan.

## 2017-07-09 DIAGNOSIS — B070 Plantar wart: Secondary | ICD-10-CM | POA: Diagnosis not present

## 2017-07-11 ENCOUNTER — Other Ambulatory Visit (INDEPENDENT_AMBULATORY_CARE_PROVIDER_SITE_OTHER): Payer: BLUE CROSS/BLUE SHIELD

## 2017-07-11 DIAGNOSIS — R7989 Other specified abnormal findings of blood chemistry: Secondary | ICD-10-CM

## 2017-07-11 DIAGNOSIS — R945 Abnormal results of liver function studies: Secondary | ICD-10-CM | POA: Diagnosis not present

## 2017-07-11 LAB — HEPATIC FUNCTION PANEL
ALT: 11 U/L (ref 0–35)
AST: 12 U/L (ref 0–37)
Albumin: 4.2 g/dL (ref 3.5–5.2)
Alkaline Phosphatase: 90 U/L (ref 39–117)
Bilirubin, Direct: 0.1 mg/dL (ref 0.0–0.3)
Total Bilirubin: 0.3 mg/dL (ref 0.2–1.2)
Total Protein: 6.4 g/dL (ref 6.0–8.3)

## 2017-08-01 DIAGNOSIS — M7542 Impingement syndrome of left shoulder: Secondary | ICD-10-CM | POA: Diagnosis not present

## 2017-08-01 DIAGNOSIS — M7502 Adhesive capsulitis of left shoulder: Secondary | ICD-10-CM | POA: Diagnosis not present

## 2017-08-17 DIAGNOSIS — R300 Dysuria: Secondary | ICD-10-CM | POA: Diagnosis not present

## 2017-08-17 DIAGNOSIS — N390 Urinary tract infection, site not specified: Secondary | ICD-10-CM | POA: Diagnosis not present

## 2017-08-31 DIAGNOSIS — M7502 Adhesive capsulitis of left shoulder: Secondary | ICD-10-CM | POA: Diagnosis not present

## 2017-10-18 ENCOUNTER — Ambulatory Visit (INDEPENDENT_AMBULATORY_CARE_PROVIDER_SITE_OTHER): Payer: BLUE CROSS/BLUE SHIELD

## 2017-10-18 DIAGNOSIS — E669 Obesity, unspecified: Secondary | ICD-10-CM | POA: Diagnosis not present

## 2017-10-18 DIAGNOSIS — Z8249 Family history of ischemic heart disease and other diseases of the circulatory system: Secondary | ICD-10-CM | POA: Diagnosis not present

## 2017-10-18 LAB — EXERCISE TOLERANCE TEST
Estimated workload: 10.1 METS
Exercise duration (min): 8 min
Exercise duration (sec): 0 s
MPHR: 165 {beats}/min
Peak HR: 151 {beats}/min
Percent HR: 91 %
RPE: 17
Rest HR: 64 {beats}/min

## 2017-12-03 ENCOUNTER — Encounter: Payer: Self-pay | Admitting: General Practice

## 2017-12-03 ENCOUNTER — Ambulatory Visit (INDEPENDENT_AMBULATORY_CARE_PROVIDER_SITE_OTHER): Payer: BLUE CROSS/BLUE SHIELD | Admitting: Family Medicine

## 2017-12-03 ENCOUNTER — Other Ambulatory Visit: Payer: Self-pay

## 2017-12-03 ENCOUNTER — Encounter: Payer: Self-pay | Admitting: Family Medicine

## 2017-12-03 VITALS — BP 122/78 | HR 66 | Temp 98.0°F | Resp 16 | Ht 68.0 in | Wt 240.1 lb

## 2017-12-03 DIAGNOSIS — E669 Obesity, unspecified: Secondary | ICD-10-CM

## 2017-12-03 DIAGNOSIS — E559 Vitamin D deficiency, unspecified: Secondary | ICD-10-CM | POA: Diagnosis not present

## 2017-12-03 DIAGNOSIS — Z0000 Encounter for general adult medical examination without abnormal findings: Secondary | ICD-10-CM

## 2017-12-03 LAB — HEPATIC FUNCTION PANEL
ALT: 13 U/L (ref 0–35)
AST: 14 U/L (ref 0–37)
Albumin: 4.1 g/dL (ref 3.5–5.2)
Alkaline Phosphatase: 86 U/L (ref 39–117)
Bilirubin, Direct: 0.1 mg/dL (ref 0.0–0.3)
Total Bilirubin: 0.5 mg/dL (ref 0.2–1.2)
Total Protein: 6.6 g/dL (ref 6.0–8.3)

## 2017-12-03 LAB — CBC WITH DIFFERENTIAL/PLATELET
Basophils Absolute: 0 10*3/uL (ref 0.0–0.1)
Basophils Relative: 0.5 % (ref 0.0–3.0)
Eosinophils Absolute: 0.1 10*3/uL (ref 0.0–0.7)
Eosinophils Relative: 2 % (ref 0.0–5.0)
HCT: 37 % (ref 36.0–46.0)
Hemoglobin: 12.4 g/dL (ref 12.0–15.0)
Lymphocytes Relative: 30.6 % (ref 12.0–46.0)
Lymphs Abs: 1.3 10*3/uL (ref 0.7–4.0)
MCHC: 33.4 g/dL (ref 30.0–36.0)
MCV: 86.4 fl (ref 78.0–100.0)
Monocytes Absolute: 0.5 10*3/uL (ref 0.1–1.0)
Monocytes Relative: 11.1 % (ref 3.0–12.0)
Neutro Abs: 2.3 10*3/uL (ref 1.4–7.7)
Neutrophils Relative %: 55.8 % (ref 43.0–77.0)
Platelets: 271 10*3/uL (ref 150.0–400.0)
RBC: 4.28 Mil/uL (ref 3.87–5.11)
RDW: 15.6 % — ABNORMAL HIGH (ref 11.5–15.5)
WBC: 4.2 10*3/uL (ref 4.0–10.5)

## 2017-12-03 LAB — BASIC METABOLIC PANEL
BUN: 12 mg/dL (ref 6–23)
CO2: 25 mEq/L (ref 19–32)
Calcium: 9.5 mg/dL (ref 8.4–10.5)
Chloride: 108 mEq/L (ref 96–112)
Creatinine, Ser: 0.95 mg/dL (ref 0.40–1.20)
GFR: 64.68 mL/min (ref >60.00)
Glucose, Bld: 113 mg/dL — ABNORMAL HIGH (ref 70–99)
Potassium: 4.1 mEq/L (ref 3.5–5.1)
Sodium: 139 mEq/L (ref 135–145)

## 2017-12-03 LAB — LIPID PANEL
Cholesterol: 146 mg/dL (ref 0–200)
HDL: 71.3 mg/dL (ref >39.00)
LDL Cholesterol: 56 mg/dL (ref 0–99)
NonHDL: 74.75
Total CHOL/HDL Ratio: 2
Triglycerides: 94 mg/dL (ref 0.0–149.0)
VLDL: 18.8 mg/dL (ref 0.0–40.0)

## 2017-12-03 LAB — VITAMIN D 25 HYDROXY (VIT D DEFICIENCY, FRACTURES): VITD: 33.87 ng/mL (ref 30.00–100.00)

## 2017-12-03 LAB — TSH: TSH: 2.31 u[IU]/mL (ref 0.35–4.50)

## 2017-12-03 NOTE — Progress Notes (Signed)
   Subjective:    Patient ID: Patricia Adams, female    DOB: 1960-09-11, 57 y.o.   MRN: [id number]  HPI CPE- UTD on pap, mammo, colonoscopy, Tdap.  No regular exercise recently- prior to 2 months ago was going to gym   Review of Systems Patient reports no vision/ hearing changes, adenopathy,fever, weight change,  persistant/recurrent hoarseness , swallowing issues, chest pain, palpitations, edema, persistant/recurrent cough, hemoptysis, dyspnea (rest/exertional/paroxysmal nocturnal), gastrointestinal bleeding (melena, rectal bleeding), abdominal pain, significant heartburn, bowel changes, GU symptoms (dysuria, hematuria, incontinence), Gyn symptoms (abnormal  bleeding, pain),  syncope, focal weakness, memory loss, numbness & tingling, skin/hair/nail changes, abnormal bruising or bleeding, anxiety, or depression.     Objective:   Physical Exam General Appearance:    Alert, cooperative, no distress, appears stated age, obese  Head:    Normocephalic, without obvious abnormality, atraumatic  Eyes:    PERRL, conjunctiva/corneas clear, EOM's intact, fundi    benign, both eyes  Ears:    Normal TM's and external ear canals, both ears  Nose:   Nares normal, septum midline, mucosa normal, no drainage    or sinus tenderness  Throat:   Lips, mucosa, and tongue normal; teeth and gums normal  Neck:   Supple, symmetrical, trachea midline, no adenopathy;    Thyroid: no enlargement/tenderness/nodules  Back:     Symmetric, no curvature, ROM normal, no CVA tenderness  Lungs:     Clear to auscultation bilaterally, respirations unlabored  Chest Wall:    No tenderness or deformity   Heart:    Regular rate and rhythm, S1 and S2 normal, no murmur, rub   or gallop  Breast Exam:    Deferred to GYN  Abdomen:     Soft, non-tender, bowel sounds active all four quadrants,    no masses, no organomegaly  Genitalia:    Deferred to GYN  Rectal:    Extremities:   Extremities normal, atraumatic, no cyanosis or edema    Pulses:   2+ and symmetric all extremities  Skin:   Skin color, texture, turgor normal, no rashes or lesions  Lymph nodes:   Cervical, supraclavicular, and axillary nodes normal  Neurologic:   CNII-XII intact, normal strength, sensation and reflexes    throughout          Assessment & Plan:

## 2017-12-03 NOTE — Assessment & Plan Note (Signed)
Pt has hx of this.  Check lab and replete prn.

## 2017-12-03 NOTE — Assessment & Plan Note (Signed)
Ongoing issue for pt.  Stressed need for healthy diet and regular exercise.  Check labs to risk stratify. 

## 2017-12-03 NOTE — Assessment & Plan Note (Signed)
Pt's PE WNL w/ exception of obesity.  UTD on pap, mammo, colonoscopy.  Check labs.  Anticipatory guidance provided.  

## 2017-12-03 NOTE — Patient Instructions (Signed)
Follow up in 1 year or as needed We'll notify you of your lab results and make any changes if needed Continue to work on healthy diet and regular exercise- you can do it! Call with any questions or concerns Happy Early Birthday!! 

## 2018-01-16 ENCOUNTER — Telehealth: Payer: Self-pay | Admitting: General Practice

## 2018-01-16 NOTE — Telephone Encounter (Signed)
fyi

## 2018-01-16 NOTE — Telephone Encounter (Signed)
I did not fax this. And Dr. Birdie Riddle stated he placed it directly in the bin for the papers.

## 2018-01-16 NOTE — Telephone Encounter (Signed)
Didn't see any forms for the patient in the basket, did you fax the forms? I checked with Levada Dy and she hasn't taken any forms from the back today.

## 2018-01-16 NOTE — Telephone Encounter (Signed)
Form completed and placed in basket  

## 2018-01-16 NOTE — Telephone Encounter (Signed)
Paperwork was filled out and given to PCP for signature.   Copied from [place] [phone]. Topic: Quick Communication - See Telephone Encounter >> Jan 15, 2018  2:57 PM Antonieta Iba C wrote: CRM for notification. See Telephone encounter for: 01/15/18.  Pt called in to check the status of her paperwork. Pt says that she faxed a physician form over on Friday. CB: [phone]

## 2018-01-22 NOTE — Telephone Encounter (Signed)
° °  Pt call to ask if the paper work was ever fax back to her. Pt would like a call back   [phone]

## 2018-02-20 ENCOUNTER — Telehealth: Payer: Self-pay | Admitting: Family Medicine

## 2018-02-20 NOTE — Telephone Encounter (Signed)
Copied from [place] [phone]. Topic: General - Other >> Feb 20, 2018  9:38 AM Yvette Rack wrote: Reason for CRM: Pt states she went to the dentist and was given an antibiotic and she would like to speak with a nurse to discuss the medication. Pt requests a call back. Cb# [phone]

## 2018-02-20 NOTE — Telephone Encounter (Signed)
Pt needs appt b/c based on the message provided by RN I am not clear what is going on.  I don't understand if pt was seen for a toothache, an oral issue, or somehow an ear infection was discussed.  I don't know why antibiotics or prednisone were prescribed so I can't evaluate this as appropriate or not

## 2018-02-20 NOTE — Telephone Encounter (Signed)
Contacted pt and she states that she went to the dentist on 02/19/18 with what she thought was a toothache; she was given clindamycin 150 mg capsules tid for a total of 21 pills; the pt said she was 4 tablets before she left the office; she also was also given methylprednisolone 4 mg dose pack (quantity 21) took 6 tablets on 02/19/18; hydrocodone- acetaminophen 5-325 q 4-6 hours; the pt says that her right jaw still hurts and her right ear feels full today; she said that she was advised by the nurse at work to call her PCP to make sure this is appropriate treatment for an ear infection; she normally sees Dr Nancy Nordmann, [facility]; the pt can be contacted at [phone]; will route to office for notification of this encounter.

## 2018-02-20 NOTE — Telephone Encounter (Signed)
Spoke with patient - she states that she had a nurse at her job look at her ear and she advised that she call her PCP.  Advised patient that Dr. Birdie Riddle would like to see her to determine the proper treatment.   I have made patient an appointment for tomorrow morning.

## 2018-02-21 ENCOUNTER — Ambulatory Visit: Payer: BLUE CROSS/BLUE SHIELD | Admitting: Family Medicine

## 2018-02-21 ENCOUNTER — Other Ambulatory Visit: Payer: Self-pay

## 2018-02-21 ENCOUNTER — Encounter: Payer: Self-pay | Admitting: Family Medicine

## 2018-02-21 VITALS — BP 120/80 | HR 61 | Temp 98.2°F | Resp 16 | Ht 68.0 in | Wt 231.2 lb

## 2018-02-21 DIAGNOSIS — H6981 Other specified disorders of Eustachian tube, right ear: Secondary | ICD-10-CM

## 2018-02-21 NOTE — Progress Notes (Signed)
   Subjective:    Patient ID: Patricia Adams, female    DOB: 03-18-1961, 57 y.o.   MRN: [id number]  HPI Jaw pain started Sunday afternoon, R side, lower jaw.  Went to dentist Tuesday.  Taking ibuprofen- 800mg , acetaminophen 1000mg  due to pain.  Dentist didn't find anything wrong w/ teeth and assumed it was her ear.  Started her on Clinda, steroids, pain meds.  Was told yesterday by RN at work it appeared to be an Radiographer, therapeutic infxn.  Yesterday had ear fullness but 'it is still mostly my jaw that bothers me'.  Pt reports this is how she presented w/ previous ear infxn'.  Pt reports today pain is localized to 1 tooth.   Review of Systems For ROS see HPI     Objective:   Physical Exam  Constitutional: She appears well-developed and well-nourished. No distress.  HENT:  Head: Normocephalic and atraumatic.  Right Ear: Tympanic membrane is retracted.  Left Ear: Tympanic membrane normal.  Nose: Mucosal edema and rhinorrhea present. Right sinus exhibits no maxillary sinus tenderness and no frontal sinus tenderness. Left sinus exhibits no maxillary sinus tenderness and no frontal sinus tenderness.  Mouth/Throat: Mucous membranes are normal. Posterior oropharyngeal erythema (w/ PND) present.  Eyes: Pupils are equal, round, and reactive to light. Conjunctivae and EOM are normal.  Neck: Normal range of motion. Neck supple.  Cardiovascular: Normal rate, regular rhythm and normal heart sounds.  Pulmonary/Chest: Effort normal and breath sounds normal. No respiratory distress. She has no wheezes. She has no rales.  Lymphadenopathy:    She has no cervical adenopathy.  Vitals reviewed.         Assessment & Plan:  Eustachian tube dysfunction- new.  Pt's R ear is not infected.  She has scars from previous infections but no current infection.  TM is retracted from eustachian tube dysfxn.  Start Flonase.  Continue steroids.  Stop Clinda and only use pain meds prn.  Pt expressed understanding and is in agreement w/  plan.

## 2018-02-21 NOTE — Patient Instructions (Addendum)
Follow up as needed or as scheduled STOP the Clindamycin CONTINUE the Medrol Dose Pack for pain and swelling relief ADD Flonase- 2 sprays each nostril once daily to improve ear congestion Drink plenty of fluids Use the pain meds as needed Call with any questions or concerns Hang in there!!!

## 2018-03-20 ENCOUNTER — Encounter: Payer: Self-pay | Admitting: General Practice

## 2018-03-20 DIAGNOSIS — Z1231 Encounter for screening mammogram for malignant neoplasm of breast: Secondary | ICD-10-CM | POA: Diagnosis not present

## 2018-03-20 DIAGNOSIS — Z01419 Encounter for gynecological examination (general) (routine) without abnormal findings: Secondary | ICD-10-CM | POA: Diagnosis not present

## 2018-03-20 DIAGNOSIS — Z6836 Body mass index (BMI) 36.0-36.9, adult: Secondary | ICD-10-CM | POA: Diagnosis not present

## 2018-03-20 DIAGNOSIS — Z124 Encounter for screening for malignant neoplasm of cervix: Secondary | ICD-10-CM | POA: Diagnosis not present

## 2018-03-20 LAB — HM PAP SMEAR

## 2018-03-20 LAB — HM MAMMOGRAPHY: HM Mammogram: NORMAL (ref 0–4)

## 2018-04-11 DIAGNOSIS — D224 Melanocytic nevi of scalp and neck: Secondary | ICD-10-CM | POA: Diagnosis not present

## 2018-04-11 DIAGNOSIS — L710 Perioral dermatitis: Secondary | ICD-10-CM | POA: Diagnosis not present

## 2018-07-18 DIAGNOSIS — F4322 Adjustment disorder with anxiety: Secondary | ICD-10-CM | POA: Diagnosis not present

## 2018-07-23 DIAGNOSIS — F4322 Adjustment disorder with anxiety: Secondary | ICD-10-CM | POA: Diagnosis not present

## 2018-08-13 DIAGNOSIS — F4322 Adjustment disorder with anxiety: Secondary | ICD-10-CM | POA: Diagnosis not present

## 2018-08-19 DIAGNOSIS — F4322 Adjustment disorder with anxiety: Secondary | ICD-10-CM | POA: Diagnosis not present

## 2018-08-27 DIAGNOSIS — F4322 Adjustment disorder with anxiety: Secondary | ICD-10-CM | POA: Diagnosis not present

## 2018-09-10 DIAGNOSIS — F4322 Adjustment disorder with anxiety: Secondary | ICD-10-CM | POA: Diagnosis not present

## 2018-09-24 DIAGNOSIS — F4322 Adjustment disorder with anxiety: Secondary | ICD-10-CM | POA: Diagnosis not present

## 2018-10-22 DIAGNOSIS — Z20828 Contact with and (suspected) exposure to other viral communicable diseases: Secondary | ICD-10-CM | POA: Diagnosis not present

## 2018-10-24 ENCOUNTER — Ambulatory Visit: Payer: Self-pay | Admitting: *Deleted

## 2018-10-24 ENCOUNTER — Telehealth: Payer: Self-pay | Admitting: Family Medicine

## 2018-10-24 NOTE — Telephone Encounter (Signed)
LMOVM to return call.
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
## 2018-10-24 NOTE — Telephone Encounter (Signed)
Pt was tested for covid on Tuesday and is waiting for results but she feels like she has a chest cold but doesn't believe its a cold. She also [place] during [place] and wanted to know if that could be playing a part in how she is feeling. Pt inquired about having prednisone called in / please advise  Triage nurse spoke with Pt due to compliant of tight chest and shortness of breath

## 2018-12-05 ENCOUNTER — Ambulatory Visit (INDEPENDENT_AMBULATORY_CARE_PROVIDER_SITE_OTHER): Payer: BC Managed Care – PPO | Admitting: Family Medicine

## 2018-12-05 ENCOUNTER — Other Ambulatory Visit: Payer: Self-pay

## 2018-12-05 ENCOUNTER — Encounter: Payer: Self-pay | Admitting: Family Medicine

## 2018-12-05 VITALS — BP 123/83 | HR 63 | Temp 98.0°F | Resp 16 | Ht 68.0 in | Wt 249.5 lb

## 2018-12-05 DIAGNOSIS — E559 Vitamin D deficiency, unspecified: Secondary | ICD-10-CM

## 2018-12-05 DIAGNOSIS — Z0000 Encounter for general adult medical examination without abnormal findings: Secondary | ICD-10-CM

## 2018-12-05 DIAGNOSIS — E669 Obesity, unspecified: Secondary | ICD-10-CM

## 2018-12-05 LAB — CBC WITH DIFFERENTIAL/PLATELET
Basophils Absolute: 0 10*3/uL (ref 0.0–0.1)
Basophils Relative: 0.5 % (ref 0.0–3.0)
Eosinophils Absolute: 0.1 10*3/uL (ref 0.0–0.7)
Eosinophils Relative: 1.8 % (ref 0.0–5.0)
HCT: 40.2 % (ref 36.0–46.0)
Hemoglobin: 13.5 g/dL (ref 12.0–15.0)
Lymphocytes Relative: 29.8 % (ref 12.0–46.0)
Lymphs Abs: 1.3 10*3/uL (ref 0.7–4.0)
MCHC: 33.6 g/dL (ref 30.0–36.0)
MCV: 91.6 fl (ref 78.0–100.0)
Monocytes Absolute: 0.6 10*3/uL (ref 0.1–1.0)
Monocytes Relative: 12.6 % — ABNORMAL HIGH (ref 3.0–12.0)
Neutro Abs: 2.5 10*3/uL (ref 1.4–7.7)
Neutrophils Relative %: 55.3 % (ref 43.0–77.0)
Platelets: 271 10*3/uL (ref 150.0–400.0)
RBC: 4.39 Mil/uL (ref 3.87–5.11)
RDW: 14.5 % (ref 11.5–15.5)
WBC: 4.5 10*3/uL (ref 4.0–10.5)

## 2018-12-05 LAB — LIPID PANEL
Cholesterol: 205 mg/dL — ABNORMAL HIGH (ref 0–200)
HDL: 70.9 mg/dL (ref >39.00)
LDL Cholesterol: 105 mg/dL — ABNORMAL HIGH (ref 0–99)
NonHDL: 134.35
Total CHOL/HDL Ratio: 3
Triglycerides: 146 mg/dL (ref 0.0–149.0)
VLDL: 29.2 mg/dL (ref 0.0–40.0)

## 2018-12-05 LAB — HEPATIC FUNCTION PANEL
ALT: 19 U/L (ref 0–35)
AST: 16 U/L (ref 0–37)
Albumin: 4.3 g/dL (ref 3.5–5.2)
Alkaline Phosphatase: 83 U/L (ref 39–117)
Bilirubin, Direct: 0.1 mg/dL (ref 0.0–0.3)
Total Bilirubin: 0.7 mg/dL (ref 0.2–1.2)
Total Protein: 6.7 g/dL (ref 6.0–8.3)

## 2018-12-05 LAB — BASIC METABOLIC PANEL
BUN: 17 mg/dL (ref 6–23)
CO2: 26 mEq/L (ref 19–32)
Calcium: 9.4 mg/dL (ref 8.4–10.5)
Chloride: 105 mEq/L (ref 96–112)
Creatinine, Ser: 0.86 mg/dL (ref 0.40–1.20)
GFR: 68.02 mL/min (ref >60.00)
Glucose, Bld: 100 mg/dL — ABNORMAL HIGH (ref 70–99)
Potassium: 4.5 mEq/L (ref 3.5–5.1)
Sodium: 138 mEq/L (ref 135–145)

## 2018-12-05 LAB — VITAMIN D 25 HYDROXY (VIT D DEFICIENCY, FRACTURES): VITD: 36.06 ng/mL (ref 30.00–100.00)

## 2018-12-05 LAB — TSH: TSH: 3.79 u[IU]/mL (ref 0.35–4.50)

## 2018-12-05 NOTE — Progress Notes (Signed)
   Subjective:    Patient ID: Patricia Adams, female    DOB: Nov 07, 1960, 58 y.o.   MRN: [id number]  HPI CPE- UTD on pap, colonoscopy, mammogram.  UTD on Tdap.  Pt has gained ~20 lbs since last visit.  Not exercising   Review of Systems Patient reports no vision/ hearing changes, adenopathy,fever,  persistant/recurrent hoarseness , swallowing issues, chest pain, palpitations, edema, persistant/recurrent cough, hemoptysis, gastrointestinal bleeding (melena, rectal bleeding), abdominal pain, significant heartburn, bowel changes, GU symptoms (dysuria, hematuria, incontinence), Gyn symptoms (abnormal  bleeding, pain),  syncope, focal weakness, memory loss, numbness & tingling, skin/hair/nail changes, abnormal bruising or bleeding, anxiety, or depression.   +SOB w/ walking outside in the heat    Objective:   Physical Exam General Appearance:    Alert, cooperative, no distress, appears stated age, obese  Head:    Normocephalic, without obvious abnormality, atraumatic  Eyes:    PERRL, conjunctiva/corneas clear, EOM's intact, fundi    benign, both eyes  Ears:    Normal TM's and external ear canals, both ears  Nose:   Deferred due to COVID  Throat:   Neck:   Supple, symmetrical, trachea midline, no adenopathy;    Thyroid: no enlargement/tenderness/nodules  Back:     Symmetric, no curvature, ROM normal, no CVA tenderness  Lungs:     Clear to auscultation bilaterally, respirations unlabored  Chest Wall:    No tenderness or deformity   Heart:    Regular rate and rhythm, S1 and S2 normal, no murmur, rub   or gallop  Breast Exam:    Deferred to GYN  Abdomen:     Soft, non-tender, bowel sounds active all four quadrants,    no masses, no organomegaly  Genitalia:    Deferred to GYN  Rectal:    Extremities:   Extremities normal, atraumatic, no cyanosis or edema  Pulses:   2+ and symmetric all extremities  Skin:   Skin color, texture, turgor normal, no rashes or lesions  Lymph nodes:   Cervical,  supraclavicular, and axillary nodes normal  Neurologic:   CNII-XII intact, normal strength, sensation and reflexes    throughout          Assessment & Plan:

## 2018-12-05 NOTE — Assessment & Plan Note (Signed)
Pt has hx of this.  Check labs and replete prn. 

## 2018-12-05 NOTE — Assessment & Plan Note (Signed)
Deteriorated.  Pt has gained ~20 lbs.  Stressed need for healthy diet and regular exercise.  Encouraged her to monitor her snacking.  Check labs to risk stratify.  Will follow.

## 2018-12-05 NOTE — Assessment & Plan Note (Signed)
Pt's PE WNL w/ exception of obesity.  UTD on pap, mammo, colonoscopy, immunizations.  Check labs.  Anticipatory guidance provided.

## 2018-12-05 NOTE — Patient Instructions (Signed)
Follow up in 6 months to recheck weight loss progress We'll notify you of your lab results and make any changes if needed Try and limit your snacking and increase your activity- you can do it! Call with any questions or concerns Stay Safe!! Happy Early Rudene Anda!

## 2018-12-06 ENCOUNTER — Encounter: Payer: Self-pay | Admitting: General Practice

## 2019-01-01 ENCOUNTER — Telehealth: Payer: Self-pay | Admitting: Family Medicine

## 2019-01-01 NOTE — Telephone Encounter (Signed)
Not in office but wanted to route to PCP to advise.

## 2019-01-01 NOTE — Telephone Encounter (Signed)
I have placed a physician result form in the bin up front with a charge sheet.

## 2019-01-10 NOTE — Telephone Encounter (Signed)
Form completed and placed in basket  

## 2019-01-10 NOTE — Telephone Encounter (Signed)
Picked up from the back faxed to the # provided on the form and email a copy to the pt.

## 2019-01-10 NOTE — Telephone Encounter (Signed)
fyi

## 2019-05-12 DIAGNOSIS — Z1231 Encounter for screening mammogram for malignant neoplasm of breast: Secondary | ICD-10-CM | POA: Diagnosis not present

## 2019-05-12 DIAGNOSIS — Z6841 Body Mass Index (BMI) 40.0 and over, adult: Secondary | ICD-10-CM | POA: Diagnosis not present

## 2019-05-12 DIAGNOSIS — Z01419 Encounter for gynecological examination (general) (routine) without abnormal findings: Secondary | ICD-10-CM | POA: Diagnosis not present

## 2019-05-12 LAB — HM MAMMOGRAPHY: HM Mammogram: NORMAL (ref 0–4)

## 2019-05-20 ENCOUNTER — Encounter: Payer: Self-pay | Admitting: General Practice

## 2019-06-09 ENCOUNTER — Ambulatory Visit: Payer: BC Managed Care – PPO | Admitting: Family Medicine

## 2019-07-02 ENCOUNTER — Ambulatory Visit: Payer: BC Managed Care – PPO | Admitting: Family Medicine

## 2019-08-04 DIAGNOSIS — R110 Nausea: Secondary | ICD-10-CM | POA: Diagnosis not present

## 2019-08-04 DIAGNOSIS — R61 Generalized hyperhidrosis: Secondary | ICD-10-CM | POA: Diagnosis not present

## 2019-08-04 DIAGNOSIS — R42 Dizziness and giddiness: Secondary | ICD-10-CM | POA: Diagnosis not present

## 2019-08-13 ENCOUNTER — Ambulatory Visit: Payer: BC Managed Care – PPO | Admitting: Family Medicine

## 2019-09-08 DIAGNOSIS — R079 Chest pain, unspecified: Secondary | ICD-10-CM | POA: Diagnosis not present

## 2019-09-08 DIAGNOSIS — M545 Low back pain: Secondary | ICD-10-CM | POA: Diagnosis not present

## 2019-09-08 DIAGNOSIS — R200 Anesthesia of skin: Secondary | ICD-10-CM | POA: Diagnosis not present

## 2019-09-08 DIAGNOSIS — R0789 Other chest pain: Secondary | ICD-10-CM | POA: Diagnosis not present

## 2019-09-08 DIAGNOSIS — Z87891 Personal history of nicotine dependence: Secondary | ICD-10-CM | POA: Diagnosis not present

## 2019-09-11 DIAGNOSIS — R079 Chest pain, unspecified: Secondary | ICD-10-CM | POA: Diagnosis not present

## 2019-09-11 DIAGNOSIS — R000 Tachycardia, unspecified: Secondary | ICD-10-CM | POA: Diagnosis not present

## 2019-09-30 ENCOUNTER — Other Ambulatory Visit: Payer: Self-pay

## 2019-09-30 ENCOUNTER — Encounter: Payer: Self-pay | Admitting: Physician Assistant

## 2019-09-30 ENCOUNTER — Ambulatory Visit: Payer: BC Managed Care – PPO | Admitting: Physician Assistant

## 2019-09-30 VITALS — BP 128/74 | HR 74 | Temp 97.9°F | Ht 68.0 in | Wt 266.6 lb

## 2019-09-30 DIAGNOSIS — R300 Dysuria: Secondary | ICD-10-CM

## 2019-09-30 DIAGNOSIS — R310 Gross hematuria: Secondary | ICD-10-CM

## 2019-09-30 LAB — POCT URINALYSIS DIPSTICK
Bilirubin, UA: NEGATIVE
Blood, UA: NEGATIVE
Glucose, UA: NEGATIVE
Ketones, UA: NEGATIVE
Nitrite, UA: NEGATIVE
Protein, UA: NEGATIVE
Spec Grav, UA: 1.01 (ref 1.010–1.025)
Urobilinogen, UA: 0.2 E.U./dL
pH, UA: 6 (ref 5.0–8.0)

## 2019-09-30 NOTE — Progress Notes (Signed)
Patient presents to clinic today c/o intermittent episodes of urinary urgency, frequency, dysuria, sometimes with gross hematuria, suprapubic pressure and urinary hesitancy.  Notes she has been evaluated twice in the past few months for this, once at work and once at a walk-in clinic.  Was treated for urinary tract infection both times but notes she had negative urine cultures at both of these visits.  Stated symptoms seem to resolve before the antibiotic would have been in her system.  Does note occasional stress urinary incontinence but no other forms of incontinence.  Denies change to bowel habits.  Does drink a lot of caffeine.  Endorses little alcohol intake.  Denies vaginal symptoms including vaginal discharge, vaginal pressure or pain.  Is currently experiencing suprapubic pressure and mild dysuria.   Past Medical History:  Diagnosis Date  . Arthritis   . Asthma    Situational asthma  . Headache   . History of chicken pox   . Neuromuscular disorder [facility])     Current Outpatient Medications on File Prior to Visit  Medication Sig Dispense Refill  . BIOTIN PO Take by mouth.    . Calcium Carbonate-Vitamin D (CALCIUM-D PO) Take 1 tablet by mouth daily.    . Multiple Vitamins-Minerals (MULTIVITAMIN ADULT PO) Take by mouth.    . Vitamin D, Ergocalciferol, 2000 units CAPS Take 1 capsule by mouth daily.     No current facility-administered medications on file prior to visit.    No Known Allergies  Family History  Problem Relation Age of Onset  . Alcohol abuse Mother   . Heart disease Mother   . Stroke Mother   . Varicose Veins Mother   . Early death Mother   . Heart disease Father   . COPD Father   . Diabetes Paternal Aunt   . Aneurysm Maternal Grandmother        Brain  . Diabetes Paternal Grandfather     Social History   Socioeconomic History  . Marital status: Married    Spouse name: Not on file  . Number of children: Not on file  . Years of education: Not on file  .  Highest education level: Not on file  Occupational History  . Not on file  Tobacco Use  . Smoking status: Former Research scientist (life sciences)  . Smokeless tobacco: Never Used  Substance and Sexual Activity  . Alcohol use: No  . Drug use: No  . Sexual activity: Not on file  Other Topics Concern  . Not on file  Social History Narrative  . Not on file   Social Determinants of Health   Financial Resource Strain:   . Difficulty of Paying Living Expenses:   Food Insecurity:   . Worried About Charity fundraiser in the Last Year:   . Arboriculturist in the Last Year:   Transportation Needs:   . Film/video editor (Medical):   Marland Kitchen Lack of Transportation (Non-Medical):   Physical Activity:   . Days of Exercise per Week:   . Minutes of Exercise per Session:   Stress:   . Feeling of Stress :   Social Connections:   . Frequency of Communication with Friends and Family:   . Frequency of Social Gatherings with Friends and Family:   . Attends Religious Services:   . Active Member of Clubs or Organizations:   . Attends Archivist Meetings:   Marland Kitchen Marital Status:     Review of Systems - See HPI.  All other  ROS are negative.  BP 128/74   Pulse 74   Temp 97.9 F (36.6 C)   Ht 5\' 8"  (1.727 m)   Wt 266 lb 9.6 oz (120.9 kg)   LMP 10/20/2011   SpO2 99%   BMI 40.54 kg/m   Physical Exam Vitals reviewed.  Constitutional:      Appearance: Normal appearance.  HENT:     Head: Normocephalic and atraumatic.  Eyes:     Conjunctiva/sclera: Conjunctivae normal.  Cardiovascular:     Rate and Rhythm: Normal rate and regular rhythm.     Pulses: Normal pulses.     Heart sounds: Normal heart sounds.  Abdominal:     General: Bowel sounds are normal. There is no distension.     Palpations: Abdomen is soft.     Tenderness: There is no abdominal tenderness. There is no right CVA tenderness or left CVA tenderness.  Musculoskeletal:     Cervical back: Neck supple.  Neurological:     Mental Status: She  is alert.  Psychiatric:        Mood and Affect: Mood normal.     Recent Results (from the past 2160 hour(s))  POCT Urinalysis Dipstick     Status: Abnormal   Collection Time: 09/30/19  3:18 PM  Result Value Ref Range   Color, UA light yellow    Clarity, UA clear    Glucose, UA Negative Negative   Bilirubin, UA Negative    Ketones, UA Negative    Spec Grav, UA 1.010 1.010 - 1.025   Blood, UA Negative    pH, UA 6.0 5.0 - 8.0   Protein, UA Negative Negative   Urobilinogen, UA 0.2 0.2 or 1.0 E.U./dL   Nitrite, UA Negative    Leukocytes, UA 4+ (A) Negative   Appearance     Odor      Assessment/Plan: 1. Dysuria 2. Gross hematuria Giving multiple urine cultures that were negative and that her symptoms seem to dissipate before antibiotic was in her system suspect there is concern for interstitial cystitis, potentially some overactive bladder present.  This needs further assessment.  UA today overall unremarkable.  Will await culture result before we consider antibiotic therapy.  Reviewed diet for IC.  Will start trial of some editing 100 mg daily.  Referral to urology placed for further evaluation as she likely needs cystoscopy given concern for IC and episodes of hematuria.  Patient voiced understanding and agreement with the plan.  Strict return and ER precautions reviewed with patient. - POCT Urinalysis Dipstick - Urine Culture - Ambulatory referral to Urology  This visit occurred during the SARS-CoV-2 public health emergency.  Safety protocols were in place, including screening questions prior to the visit, additional usage of staff PPE, and extensive cleaning of exam room while observing appropriate contact time as indicated for disinfecting solutions.     Leeanne Rio, PA-C

## 2019-09-30 NOTE — Patient Instructions (Signed)
Your urine dip is negative for blood and no overt trace of infection. I am sending for a culture but we will hold off on antibiotics until I get this result, especially since you have had multiple rounds recently and negative culture results.   I am concerned that you are having episodes of interstitial cystitis. Keep hydrated, avoid caffeine and alcohol, spicy foods.   Start OTC Cimetidine (Tagamet) once daily to see if this helps with symptoms.   I am setting you up with Urology for further evaluation giving this ongoing issue and bouts of blood in the urine.   If you note any new or worsening symptoms in the meantime, please let us know ASAP.   You should hear from the specialist within a week.

## 2019-10-01 LAB — URINE CULTURE
MICRO NUMBER:: 10566178
SPECIMEN QUALITY:: ADEQUATE

## 2019-11-03 DIAGNOSIS — R310 Gross hematuria: Secondary | ICD-10-CM | POA: Diagnosis not present

## 2019-11-21 DIAGNOSIS — R310 Gross hematuria: Secondary | ICD-10-CM | POA: Diagnosis not present

## 2019-11-21 DIAGNOSIS — D3501 Benign neoplasm of right adrenal gland: Secondary | ICD-10-CM | POA: Diagnosis not present

## 2019-11-21 DIAGNOSIS — N3289 Other specified disorders of bladder: Secondary | ICD-10-CM | POA: Diagnosis not present

## 2019-11-21 DIAGNOSIS — E278 Other specified disorders of adrenal gland: Secondary | ICD-10-CM | POA: Diagnosis not present

## 2019-11-28 DIAGNOSIS — R3915 Urgency of urination: Secondary | ICD-10-CM | POA: Diagnosis not present

## 2019-11-28 DIAGNOSIS — R310 Gross hematuria: Secondary | ICD-10-CM | POA: Diagnosis not present

## 2019-12-18 ENCOUNTER — Ambulatory Visit (INDEPENDENT_AMBULATORY_CARE_PROVIDER_SITE_OTHER): Payer: BC Managed Care – PPO | Admitting: Family Medicine

## 2019-12-18 ENCOUNTER — Other Ambulatory Visit: Payer: Self-pay | Admitting: Family Medicine

## 2019-12-18 ENCOUNTER — Encounter: Payer: Self-pay | Admitting: Family Medicine

## 2019-12-18 ENCOUNTER — Other Ambulatory Visit: Payer: Self-pay

## 2019-12-18 VITALS — BP 113/80 | HR 76 | Temp 98.1°F | Resp 16 | Ht 67.0 in | Wt 268.0 lb

## 2019-12-18 DIAGNOSIS — Z0000 Encounter for general adult medical examination without abnormal findings: Secondary | ICD-10-CM | POA: Diagnosis not present

## 2019-12-18 DIAGNOSIS — E559 Vitamin D deficiency, unspecified: Secondary | ICD-10-CM

## 2019-12-18 DIAGNOSIS — E039 Hypothyroidism, unspecified: Secondary | ICD-10-CM

## 2019-12-18 LAB — LDL CHOLESTEROL, DIRECT: Direct LDL: 62 mg/dL

## 2019-12-18 LAB — CBC WITH DIFFERENTIAL/PLATELET
Basophils Absolute: 0 10*3/uL (ref 0.0–0.1)
Basophils Relative: 0.3 % (ref 0.0–3.0)
Eosinophils Absolute: 0.1 10*3/uL (ref 0.0–0.7)
Eosinophils Relative: 1.5 % (ref 0.0–5.0)
HCT: 39.7 % (ref 36.0–46.0)
Hemoglobin: 13.4 g/dL (ref 12.0–15.0)
Lymphocytes Relative: 24.7 % (ref 12.0–46.0)
Lymphs Abs: 1.4 10*3/uL (ref 0.7–4.0)
MCHC: 33.8 g/dL (ref 30.0–36.0)
MCV: 91.6 fl (ref 78.0–100.0)
Monocytes Absolute: 0.6 10*3/uL (ref 0.1–1.0)
Monocytes Relative: 11.1 % (ref 3.0–12.0)
Neutro Abs: 3.7 10*3/uL (ref 1.4–7.7)
Neutrophils Relative %: 62.4 % (ref 43.0–77.0)
Platelets: 265 10*3/uL (ref 150.0–400.0)
RBC: 4.34 Mil/uL (ref 3.87–5.11)
RDW: 13.3 % (ref 11.5–15.5)
WBC: 5.8 10*3/uL (ref 4.0–10.5)

## 2019-12-18 LAB — LIPID PANEL
Cholesterol: 211 mg/dL — ABNORMAL HIGH (ref 0–200)
HDL: 65.2 mg/dL (ref >39.00)
NonHDL: 145.61
Total CHOL/HDL Ratio: 3
Triglycerides: 203 mg/dL — ABNORMAL HIGH (ref 0.0–149.0)
VLDL: 40.6 mg/dL — ABNORMAL HIGH (ref 0.0–40.0)

## 2019-12-18 LAB — BASIC METABOLIC PANEL
BUN: 17 mg/dL (ref 6–23)
CO2: 28 mEq/L (ref 19–32)
Calcium: 9.9 mg/dL (ref 8.4–10.5)
Chloride: 104 mEq/L (ref 96–112)
Creatinine, Ser: 0.9 mg/dL (ref 0.40–1.20)
GFR: 64.31 mL/min (ref >60.00)
Glucose, Bld: 104 mg/dL — ABNORMAL HIGH (ref 70–99)
Potassium: 5.1 mEq/L (ref 3.5–5.1)
Sodium: 139 mEq/L (ref 135–145)

## 2019-12-18 LAB — HEPATIC FUNCTION PANEL
ALT: 16 U/L (ref 0–35)
AST: 14 U/L (ref 0–37)
Albumin: 4.3 g/dL (ref 3.5–5.2)
Alkaline Phosphatase: 95 U/L (ref 39–117)
Bilirubin, Direct: 0.1 mg/dL (ref 0.0–0.3)
Total Bilirubin: 0.6 mg/dL (ref 0.2–1.2)
Total Protein: 7.2 g/dL (ref 6.0–8.3)

## 2019-12-18 LAB — VITAMIN D 25 HYDROXY (VIT D DEFICIENCY, FRACTURES): VITD: 28.93 ng/mL — ABNORMAL LOW (ref 30.00–100.00)

## 2019-12-18 LAB — TSH: TSH: 5.15 u[IU]/mL — ABNORMAL HIGH (ref 0.35–4.50)

## 2019-12-18 MED ORDER — VITAMIN D (ERGOCALCIFEROL) 1.25 MG (50000 UNIT) PO CAPS: 50000.0000 [IU] | ORAL_CAPSULE | ORAL | 0 refills | Status: AC

## 2019-12-18 MED ORDER — LEVOTHYROXINE SODIUM 50 MCG PO TABS: 50.0000 ug | ORAL_TABLET | Freq: Every day | ORAL | 3 refills | Status: DC

## 2019-12-18 NOTE — Assessment & Plan Note (Signed)
New.  Pt's 8 lb weight gain places her BMI at 41.97.  Stressed need for healthy diet and regular exercise.  Check labs to risk stratify.  Will follow.

## 2019-12-18 NOTE — Assessment & Plan Note (Signed)
Pt's PE WNL w/ exception of obesity.  UTD on pap, mammo, colonoscopy, Tdap, COVID.  Check labs.  Anticipatory guidance provided.

## 2019-12-18 NOTE — Patient Instructions (Signed)
Follow up in 6 months to recheck weight loss progress We'll notify you of your lab results and make any changes if needed Continue to work on healthy diet and regular exercise- you can do it! Call with any questions or concerns Stay Safe!  Stay Healthy! HAPPY BIRTHDAY!!

## 2019-12-18 NOTE — Assessment & Plan Note (Signed)
Check labs and replete prn. 

## 2019-12-18 NOTE — Progress Notes (Signed)
   Subjective:    Patient ID: Patricia Adams, female    DOB: 1960-07-09, 59 y.o.   MRN: [id number]  HPI CPE- UTD on pap, mammo, colonscopy,Tdap.  Has had 1st COVID vaccine.  Will hold on flu until work provides.  Pt has gained 8 lbs since last visit  Health Maintenance  Topic Date Due  . INFLUENZA VACCINE  11/23/2019  . Hepatitis C Screening  12/17/2020 (Originally 16-Jun-1960)  . HIV Screening  12/17/2020 (Originally 12/17/1975)  . COVID-19 Vaccine (2 - Pfizer 2-dose series) 01/04/2020  . PAP SMEAR-Modifier  03/20/2021  . MAMMOGRAM  05/11/2021  . COLONOSCOPY  01/22/2025  . TETANUS/TDAP  04/05/2025    Patient Care Team    Relationship Specialty Notifications Start End  Midge Minium, MD PCP - General Family Medicine  04/20/15   Bobbye Charleston, MD Consulting Physician Obstetrics and Gynecology  04/20/15   Juanita Craver, MD Consulting Physician Gastroenterology  04/20/15       Review of Systems Patient reports no vision/ hearing changes, adenopathy,fever, persistant/recurrent hoarseness , swallowing issues, chest pain, palpitations, edema, persistant/recurrent cough, hemoptysis, dyspnea (rest/exertional/paroxysmal nocturnal), gastrointestinal bleeding (melena, rectal bleeding), abdominal pain, significant heartburn, bowel changes, GU symptoms (dysuria, hematuria, incontinence), Gyn symptoms (abnormal  bleeding, pain),  syncope, focal weakness, memory loss, numbness & tingling, skin/hair/nail changes, abnormal bruising or bleeding, anxiety, or depression.   This visit occurred during the SARS-CoV-2 public health emergency.  Safety protocols were in place, including screening questions prior to the visit, additional usage of staff PPE, and extensive cleaning of exam room while observing appropriate contact time as indicated for disinfecting solutions.       Objective:   Physical Exam General Appearance:    Alert, cooperative, no distress, appears stated age, obese  Head:     Normocephalic, without obvious abnormality, atraumatic  Eyes:    PERRL, conjunctiva/corneas clear, EOM's intact, fundi    benign, both eyes  Ears:    Normal TM's and external ear canals, both ears  Nose:   Deferred due to COVID  Throat:   Neck:   Supple, symmetrical, trachea midline, no adenopathy;    Thyroid: no enlargement/tenderness/nodules  Back:     Symmetric, no curvature, ROM normal, no CVA tenderness  Lungs:     Clear to auscultation bilaterally, respirations unlabored  Chest Wall:    No tenderness or deformity   Heart:    Regular rate and rhythm, S1 and S2 normal, no murmur, rub   or gallop  Breast Exam:    Deferred to GYN  Abdomen:     Soft, non-tender, bowel sounds active all four quadrants,    no masses, no organomegaly  Genitalia:    Deferred to GYN  Rectal:    Extremities:   Extremities normal, atraumatic, no cyanosis or edema  Pulses:   2+ and symmetric all extremities  Skin:   Skin color, texture, turgor normal, no rashes or lesions  Lymph nodes:   Cervical, supraclavicular, and axillary nodes normal  Neurologic:   CNII-XII intact, normal strength, sensation and reflexes    throughout          Assessment & Plan:

## 2020-01-08 DIAGNOSIS — J029 Acute pharyngitis, unspecified: Secondary | ICD-10-CM | POA: Diagnosis not present

## 2020-01-08 DIAGNOSIS — R519 Headache, unspecified: Secondary | ICD-10-CM | POA: Diagnosis not present

## 2020-01-08 DIAGNOSIS — Z20828 Contact with and (suspected) exposure to other viral communicable diseases: Secondary | ICD-10-CM | POA: Diagnosis not present

## 2020-01-08 DIAGNOSIS — R070 Pain in throat: Secondary | ICD-10-CM | POA: Diagnosis not present

## 2020-01-12 ENCOUNTER — Other Ambulatory Visit: Payer: Self-pay

## 2020-01-12 ENCOUNTER — Telehealth (INDEPENDENT_AMBULATORY_CARE_PROVIDER_SITE_OTHER): Payer: BC Managed Care – PPO | Admitting: Family Medicine

## 2020-01-12 ENCOUNTER — Encounter: Payer: Self-pay | Admitting: Family Medicine

## 2020-01-12 DIAGNOSIS — J40 Bronchitis, not specified as acute or chronic: Secondary | ICD-10-CM

## 2020-01-12 DIAGNOSIS — Z7189 Other specified counseling: Secondary | ICD-10-CM | POA: Diagnosis not present

## 2020-01-12 MED ORDER — ALBUTEROL SULFATE HFA 108 (90 BASE) MCG/ACT IN AERS: 2.0000 | INHALATION_SPRAY | Freq: Four times a day (QID) | RESPIRATORY_TRACT | 0 refills | Status: DC | PRN

## 2020-01-12 MED ORDER — AZITHROMYCIN 250 MG PO TABS: ORAL_TABLET | ORAL | 0 refills | Status: DC

## 2020-01-12 NOTE — Progress Notes (Signed)
I have discussed the procedure for the virtual visit with the patient who has given consent to proceed with assessment and treatment.   Pt unable to obtain vitals.   Xai Frerking L Scarlettrose Costilow, CMA     

## 2020-01-12 NOTE — Progress Notes (Signed)
   Virtual Visit via Video   I connected with patient on 01/12/20 at  1:30 PM EDT by a video enabled telemedicine application and verified that I am speaking with the correct person using two identifiers.  Location patient: Home Location provider: Acupuncturist, Office Persons participating in the virtual visit: Patient, Provider, [place] (Jess B)  I discussed the limitations of evaluation and management by telemedicine and the availability of in person appointments. The patient expressed understanding and agreed to proceed.  Subjective:   HPI:   Cough- sxs started 7-8 days ago w/ sore throat that 'lasted for days'.  + HA.  Got tested on Thursday at [facility] in [place]- rapid test was negative.  Also had send out testing that was negative.  Friday night developed nasal congestion.  Pt now feels 'it has gone to my chest'.  + cough- nonproductive.  No fevers.  Sore throat is better.  Denies sinus pain/pressure.  Hx of smoking and this feels similar to bronchitis she used to get when smoking.  Some wheezing w/ prolonged coughing.    ROS:   See pertinent positives and negatives per HPI.  Patient Active Problem List   Diagnosis Date Noted  . Family history of early CAD 06/04/2017  . Vitamin D deficiency 11/30/2016  . Physical exam 05/05/2015  . Morbid obesity ([place]) 05/05/2015  . Arthritis of knee, degenerative 05/05/2015  . Pain and swelling of right lower leg 04/20/2015  . Trapezius muscle spasm 04/20/2015  . Right elbow pain 04/20/2015    Social History   Tobacco Use  . Smoking status: Former Research scientist (life sciences)  . Smokeless tobacco: Never Used  Substance Use Topics  . Alcohol use: No    Current Outpatient Medications:  .  BIOTIN PO, Take by mouth., Disp: , Rfl:  .  Calcium Carbonate-Vitamin D (CALCIUM-D PO), Take 1 tablet by mouth daily., Disp: , Rfl:  .  levothyroxine (SYNTHROID) 50 MCG tablet, Take 1 tablet (50 mcg total) by mouth daily., Disp: 90 tablet, Rfl: 3 .  Multiple  Vitamins-Minerals (MULTIVITAMIN ADULT PO), Take by mouth., Disp: , Rfl:  .  TURMERIC PO, Take by mouth., Disp: , Rfl:  .  Vitamin D, Ergocalciferol, (DRISDOL) 1.25 MG (50000 UNIT) CAPS capsule, Take 1 capsule (50,000 Units total) by mouth every 7 (seven) days., Disp: 12 capsule, Rfl: 0  No Known Allergies  Objective:   LMP 10/20/2011  AAOx3, NAD NCAT, EOMI No obvious CN deficits Coloring WNL Dry cough w/ some SOB after coughing spells, + wheezing Thought process is linear.  Mood is appropriate.   Assessment and Plan:   Bronchitis w/ wheezing- new.  Pt reports this is how she used to feel when she would get bronchitis as a smoker.  2 negative COVID tests but did caution her that we have seen some people w/ multiple negative tests before they test +.  Will start a Zpack to cover for atypical causes of infection, albuterol prn, and Mucinex DM.  Cautioned that if shortness of breath worsens or other concerns she needs to let me know immediately.  Pt expressed understanding and is in agreement w/ plan.   Annye Asa, MD 01/12/2020

## 2020-01-14 ENCOUNTER — Telehealth: Payer: BC Managed Care – PPO | Admitting: Family Medicine

## 2020-01-14 ENCOUNTER — Telehealth: Payer: Self-pay | Admitting: Family Medicine

## 2020-01-14 DIAGNOSIS — Z1152 Encounter for screening for COVID-19: Secondary | ICD-10-CM | POA: Diagnosis not present

## 2020-01-14 DIAGNOSIS — J069 Acute upper respiratory infection, unspecified: Secondary | ICD-10-CM | POA: Diagnosis not present

## 2020-01-14 DIAGNOSIS — R05 Cough: Secondary | ICD-10-CM | POA: Diagnosis not present

## 2020-01-14 NOTE — Telephone Encounter (Signed)
Patient states she had a virtual visit with Dr. Birdie Riddle on Monday 9/20.    Patient states she was instructed to call in if she was not feeling any better.  States that she feels symptoms are getting worse.  States she is "wheezy", has cough and has chest congestion.  States she is not able to get chest congestion up.   Also believes that albuterol is making her feel nauseous.  Patient is going to look for COVID testing today.  She is requesting a call back in regard to what the next steps will be.

## 2020-01-14 NOTE — Telephone Encounter (Signed)
I agree w/ COVID testing.  She needs to drink lots of fluids, rest, add OTC Vit C/Vit D/Zinc for immune support and if shortness of breath is worsening, she will need to go to ER or UC to be evaluated

## 2020-01-14 NOTE — Telephone Encounter (Signed)
FYI

## 2020-01-14 NOTE — Telephone Encounter (Signed)
Patient notified of PCP recommendations and is agreement and expresses an understanding.  She has an appointment at 3:15 with urgent med to be evaluated and tested for COVID.

## 2020-01-14 NOTE — Telephone Encounter (Signed)
Please advise 

## 2020-01-14 NOTE — Telephone Encounter (Signed)
Patient is calling in to let Dr.Tabori know that her rapid test came back negative and they are sending off a PCR one as well.

## 2020-01-19 DIAGNOSIS — D485 Neoplasm of uncertain behavior of skin: Secondary | ICD-10-CM | POA: Diagnosis not present

## 2020-01-19 DIAGNOSIS — D224 Melanocytic nevi of scalp and neck: Secondary | ICD-10-CM | POA: Diagnosis not present

## 2020-01-19 DIAGNOSIS — C44311 Basal cell carcinoma of skin of nose: Secondary | ICD-10-CM | POA: Diagnosis not present

## 2020-01-20 ENCOUNTER — Telehealth: Payer: Self-pay | Admitting: Family Medicine

## 2020-01-20 NOTE — Telephone Encounter (Signed)
Please advise 

## 2020-01-20 NOTE — Telephone Encounter (Signed)
I would recommend a f/u video visit to review sxs, determine if additional testing is needed, and next best steps

## 2020-01-20 NOTE — Telephone Encounter (Signed)
Can we schedule pt? With either provider

## 2020-01-20 NOTE — Telephone Encounter (Signed)
Pt called in stating that she was seen at the [facility] on 9/[facility], she has tested negative for covid 2 times. She states that she is using the Albuterol inhaler every 6hrs. She did complete the Zithromax. She states that she is still having a dry cough, she feels very tired, and she starts coughing while trying to talk. Pt wanted to know what else she needs to do and if she needs to be seen again.   Please advise pt uses [facility] on [address].

## 2020-01-20 NOTE — Addendum Note (Signed)
Addended by: Kelle Darting A on: 01/20/2020 09:52 AM   Modules accepted: Orders

## 2020-01-21 ENCOUNTER — Encounter: Payer: Self-pay | Admitting: Family Medicine

## 2020-01-21 ENCOUNTER — Other Ambulatory Visit (INDEPENDENT_AMBULATORY_CARE_PROVIDER_SITE_OTHER): Payer: BC Managed Care – PPO

## 2020-01-21 ENCOUNTER — Other Ambulatory Visit: Payer: Self-pay

## 2020-01-21 ENCOUNTER — Telehealth (INDEPENDENT_AMBULATORY_CARE_PROVIDER_SITE_OTHER): Payer: BC Managed Care – PPO | Admitting: Family Medicine

## 2020-01-21 DIAGNOSIS — R059 Cough, unspecified: Secondary | ICD-10-CM

## 2020-01-21 DIAGNOSIS — E039 Hypothyroidism, unspecified: Secondary | ICD-10-CM | POA: Diagnosis not present

## 2020-01-21 DIAGNOSIS — R05 Cough: Secondary | ICD-10-CM | POA: Diagnosis not present

## 2020-01-21 LAB — TSH: TSH: 2.7 mIU/L (ref 0.40–4.50)

## 2020-01-21 MED ORDER — PREDNISONE 10 MG PO TABS: ORAL_TABLET | ORAL | 0 refills | Status: DC

## 2020-01-21 NOTE — Progress Notes (Signed)
urin

## 2020-01-21 NOTE — Progress Notes (Signed)
I have discussed the procedure for the virtual visit with the patient who has given consent to proceed with assessment and treatment.   Pt unable to obtain vitals.   Shannon Balthazar L Alaura Schippers, CMA     

## 2020-01-21 NOTE — Progress Notes (Signed)
   Virtual Visit via Video   I connected with patient on 01/21/20 at 11:00 AM EDT by a video enabled telemedicine application and verified that I am speaking with the correct person using two identifiers.  Location patient: Home Location provider: Acupuncturist, Office Persons participating in the virtual visit: Patient, Provider, [place] (Jess B)  I discussed the limitations of evaluation and management by telemedicine and the availability of in person appointments. The patient expressed understanding and agreed to proceed.  Subjective:   HPI:   URI- pt had a video visit on 9/20 and was treated w/ a Zpack for bronchitis.  She was encouraged to get COVID tested.  COVID test (both rapid and PCR) done 9/22 and were negative.  Pt continues to have cough w/ too much talking or any exertion.  Otherwise is feeling better.  No N/V/D.  No fevers.  'it felt like I was turning a corner this weeked' but then sxs worsened.  Has been using albuterol Q6 w/o improvement.  Pt has hx of post-infectious cough and has required prednisone in the past  ROS:   See pertinent positives and negatives per HPI.  Patient Active Problem List   Diagnosis Date Noted  . Family history of early CAD 06/04/2017  . Vitamin D deficiency 11/30/2016  . Physical exam 05/05/2015  . Morbid obesity ([place]) 05/05/2015  . Arthritis of knee, degenerative 05/05/2015  . Pain and swelling of right lower leg 04/20/2015  . Trapezius muscle spasm 04/20/2015  . Right elbow pain 04/20/2015    Social History   Tobacco Use  . Smoking status: Former Research scientist (life sciences)  . Smokeless tobacco: Never Used  Substance Use Topics  . Alcohol use: No    Current Outpatient Medications:  .  albuterol (VENTOLIN HFA) 108 (90 Base) MCG/ACT inhaler, Inhale 2 puffs into the lungs every 6 (six) hours as needed for wheezing or shortness of breath., Disp: 18 g, Rfl: 0 .  BIOTIN PO, Take by mouth., Disp: , Rfl:  .  Calcium Carbonate-Vitamin D (CALCIUM-D PO),  Take 1 tablet by mouth daily., Disp: , Rfl:  .  levothyroxine (SYNTHROID) 50 MCG tablet, Take 1 tablet (50 mcg total) by mouth daily., Disp: 90 tablet, Rfl: 3 .  Multiple Vitamins-Minerals (MULTIVITAMIN ADULT PO), Take by mouth., Disp: , Rfl:  .  TURMERIC PO, Take by mouth., Disp: , Rfl:  .  Vitamin D, Ergocalciferol, (DRISDOL) 1.25 MG (50000 UNIT) CAPS capsule, Take 1 capsule (50,000 Units total) by mouth every 7 (seven) days., Disp: 12 capsule, Rfl: 0  No Known Allergies  Objective:   LMP 10/20/2011  AAOx3, NAD NCAT, EOMI No obvious CN deficits Coloring WNL Pt is able to speak clearly, coherently without shortness of breath or increased work of breathing. + dry cough  Thought process is linear.  Mood is appropriate.   Assessment and Plan:   Cough- pt seemingly has a post-infectious cough after recent illness.  She had 2 rapid tests and a PCR send out and all were negative.  She reports feeling well other than the cough.  Will start Prednisone to improve airway inflammation.  Reviewed supportive care and red flags that should prompt return.  Pt expressed understanding and is in agreement w/ plan.    Annye Asa, MD 01/21/2020

## 2020-01-22 ENCOUNTER — Encounter: Payer: Self-pay | Admitting: General Practice

## 2020-01-29 ENCOUNTER — Encounter: Payer: Self-pay | Admitting: Family Medicine

## 2020-02-02 NOTE — Telephone Encounter (Signed)
Faxed to [phone]

## 2020-02-03 DIAGNOSIS — R8271 Bacteriuria: Secondary | ICD-10-CM | POA: Diagnosis not present

## 2020-02-03 DIAGNOSIS — R310 Gross hematuria: Secondary | ICD-10-CM | POA: Diagnosis not present

## 2020-02-03 DIAGNOSIS — R350 Frequency of micturition: Secondary | ICD-10-CM | POA: Diagnosis not present

## 2020-03-04 DIAGNOSIS — C44311 Basal cell carcinoma of skin of nose: Secondary | ICD-10-CM | POA: Diagnosis not present

## 2020-03-04 DIAGNOSIS — I788 Other diseases of capillaries: Secondary | ICD-10-CM | POA: Diagnosis not present

## 2020-04-06 DIAGNOSIS — L821 Other seborrheic keratosis: Secondary | ICD-10-CM | POA: Diagnosis not present

## 2020-04-06 DIAGNOSIS — D1801 Hemangioma of skin and subcutaneous tissue: Secondary | ICD-10-CM | POA: Diagnosis not present

## 2020-04-06 DIAGNOSIS — L719 Rosacea, unspecified: Secondary | ICD-10-CM | POA: Diagnosis not present

## 2020-04-06 DIAGNOSIS — L814 Other melanin hyperpigmentation: Secondary | ICD-10-CM | POA: Diagnosis not present

## 2020-04-15 DIAGNOSIS — Z03818 Encounter for observation for suspected exposure to other biological agents ruled out: Secondary | ICD-10-CM | POA: Diagnosis not present

## 2020-04-19 DIAGNOSIS — Z20822 Contact with and (suspected) exposure to covid-19: Secondary | ICD-10-CM | POA: Diagnosis not present

## 2020-06-03 DIAGNOSIS — Z1231 Encounter for screening mammogram for malignant neoplasm of breast: Secondary | ICD-10-CM | POA: Diagnosis not present

## 2020-06-03 DIAGNOSIS — Z6841 Body Mass Index (BMI) 40.0 and over, adult: Secondary | ICD-10-CM | POA: Diagnosis not present

## 2020-06-03 DIAGNOSIS — Z01419 Encounter for gynecological examination (general) (routine) without abnormal findings: Secondary | ICD-10-CM | POA: Diagnosis not present

## 2020-06-03 LAB — HM MAMMOGRAPHY

## 2020-06-18 ENCOUNTER — Ambulatory Visit: Payer: BC Managed Care – PPO | Admitting: Family Medicine

## 2020-07-19 ENCOUNTER — Ambulatory Visit: Payer: BC Managed Care – PPO | Admitting: Family Medicine

## 2020-08-19 ENCOUNTER — Telehealth (INDEPENDENT_AMBULATORY_CARE_PROVIDER_SITE_OTHER): Payer: BC Managed Care – PPO | Admitting: Family Medicine

## 2020-08-19 DIAGNOSIS — U071 COVID-19: Secondary | ICD-10-CM | POA: Diagnosis not present

## 2020-08-19 MED ORDER — MOLNUPIRAVIR 200 MG PO CAPS: 800.0000 mg | ORAL_CAPSULE | Freq: Two times a day (BID) | ORAL | 0 refills | Status: AC

## 2020-08-19 MED ORDER — BENZONATATE 100 MG PO CAPS: 100.0000 mg | ORAL_CAPSULE | Freq: Three times a day (TID) | ORAL | 0 refills | Status: DC | PRN

## 2020-08-19 NOTE — Progress Notes (Signed)
Virtual Visit via Video Note  I connected with Patricia Adams  on 08/19/20 at 10:00 AM EDT by a video enabled telemedicine application and verified that I am speaking with the correct person using two identifiers.  Location patient: home, [place] Location provider:work or home office Persons participating in the virtual visit: patient, provider  I discussed the limitations of evaluation and management by telemedicine and the availability of in person appointments. The patient expressed understanding and agreed to proceed.   HPI:  Acute telemedicine visit for Covid19: -Onset: 08/15/20, had positive covid test on the 26th -Symptoms include: nasal congestion, cough, low grade temp - no temp today -Denies: CP, SOB, NVD, inability to eat/drink/get out of bed -Has tried: sudafed helped -Pertinent past medical history: obesity, thyroid disease -Pertinent medication allergies: nkda -COVID-19 vaccine status: 2 doses of pfizer -no recent labs  ROS: See pertinent positives and negatives per HPI.  Past Medical History:  Diagnosis Date  . Arthritis   . Asthma    Situational asthma  . Headache   . History of chicken pox   . Neuromuscular disorder ([place])     No past surgical history on file.   Current Outpatient Medications:  .  benzonatate (TESSALON PERLES) 100 MG capsule, Take 1 capsule (100 mg total) by mouth 3 (three) times daily as needed., Disp: 20 capsule, Rfl: 0 .  Molnupiravir 200 MG CAPS, Take 4 capsules (800 mg total) by mouth in the morning and at bedtime for 5 days., Disp: 40 capsule, Rfl: 0 .  albuterol (VENTOLIN HFA) 108 (90 Base) MCG/ACT inhaler, Inhale 2 puffs into the lungs every 6 (six) hours as needed for wheezing or shortness of breath., Disp: 18 g, Rfl: 0 .  Calcium Carbonate-Vitamin D (CALCIUM-D PO), Take 1 tablet by mouth daily., Disp: , Rfl:  .  levothyroxine (SYNTHROID) 50 MCG tablet, Take 1 tablet (50 mcg total) by mouth daily., Disp: 90 tablet, Rfl: 3 .  Vitamin D,  Ergocalciferol, (DRISDOL) 1.25 MG (50000 UNIT) CAPS capsule, Take 1 capsule (50,000 Units total) by mouth every 7 (seven) days., Disp: 12 capsule, Rfl: 0  EXAM:  VITALS per patient if applicable:  GENERAL: alert, oriented, appears well and in no acute distress  HEENT: atraumatic, conjunttiva clear, no obvious abnormalities on inspection of external nose and ears  NECK: normal movements of the head and neck  LUNGS: on inspection no signs of respiratory distress, breathing rate appears normal, no obvious gross SOB, gasping or wheezing  CV: no obvious cyanosis  MS: moves all visible extremities without noticeable abnormality  PSYCH/NEURO: pleasant and cooperative, no obvious depression or anxiety, speech and thought processing grossly intact  ASSESSMENT AND PLAN:  Discussed the following assessment and plan:  COVID-19  -we discussed possible serious and likely etiologies, options for evaluation and workup, limitations of telemedicine visit vs in person visit, treatment, treatment risks and precautions. Pt prefers to treat via telemedicine empirically rather than in person at this moment.  Discussed treatment options, ideal treatment window, potential complications, isolation and precautions for COVID-19.  She did want a prescription molnupiravir after lengthy discussion options, risks, side effects,  EUA, etc and for cough, Tessalon Rx sent. A link to the FDA patient information for Molnupiravir is provided in patient instructions. She prefers to pick these up at her pharmacy and agrees to discuss the medication further with the pharmacist. I did let her know that I can not guarantee availability of the covid medication at her pharmacy and did offer to send  it to the [place], but she prefers it go to her pharmacy. Other symptomatic care measures summarized in patient instructions. Work/School slipped offered: provided in patient instructions Scheduled follow up with PCP offered:  agrees to follow up as needed Advised to seek prompt in person care if worsening, new symptoms arise, or if is not improving with treatment. Discussed options for inperson care if PCP office not available. Did let this patient know that I only do telemedicine on Tuesdays and Thursdays for [place]. Advised to schedule follow up visit with PCP or [facility] if any further questions or concerns to avoid delays in care.   I discussed the assessment and treatment plan with the patient. The patient was provided an opportunity to ask questions and all were answered. The patient agreed with the plan and demonstrated an understanding of the instructions.     Lucretia Kern, DO

## 2020-08-19 NOTE — Patient Instructions (Addendum)
   ---------------------------------------------------------------------------------------------------------------------------      WORK SLIP:  Patient Patricia Adams,  May 01, 1960, was seen for a medical visit today, 08/19/20 . Please excuse from work for a COVID like illness. We advise 10 days minimum from the onset of symptoms (08/14/20) PLUS 1 day of no fever and improved symptoms. Will defer to employer for a sooner return to work if symptoms have resolved, it is greater than 5 days since the positive test and the patient can wear a high-quality, tight fitting mask such as N95 or KN95 at all times for an additional 5 days. Would also suggest COVID19 antigen testing is negative prior to return.  Sincerely: E-signature: Dr. Colin Benton, DO [facility] [place] Ph: [phone]   ------------------------------------------------------------------------------------------------------------------------------    HOME CARE TIPS:   -I sent the medication(s) we discussed to your pharmacy: Meds ordered this encounter  Medications  . benzonatate (TESSALON PERLES) 100 MG capsule    Sig: Take 1 capsule (100 mg total) by mouth 3 (three) times daily as needed.    Dispense:  20 capsule    Refill:  0  . Molnupiravir 200 MG CAPS    Sig: Take 4 capsules (800 mg total) by mouth in the morning and at bedtime for 5 days.    Dispense:  40 capsule    Refill:  0    PLEASE SEE THIS LINK and information provided by pharmacist for details regarding the medication Molnupiravir prior to taking: [email or web address]   -can use tylenol or aleve if needed for fevers, aches and pains per instructions  -can use nasal saline a few times per day if you have nasal congestion; sometimes  a short course of Afrin nasal spray for 3 days can help with symptoms as well  -stay hydrated, drink plenty of fluids and eat small healthy meals - avoid dairy  -can take 1000  IU (103mcg) Vit D3 and 100-500 mg of Vit C daily per instructions  -If the Covid test is positive, check out the CDC website for more information on home care, transmission and treatment for COVID19  -follow up with your doctor in 2-3 days unless improving and feeling better  -stay home while sick, except to seek medical care, and if you have [place] ideally it would be best to stay home for a full 10 days since the onset of symptoms PLUS one day of no fever and feeling better. Wear a good mask (such as N95 or KN95) if around others to reduce the risk of transmission.  It was nice to meet you today, and I really hope you are feeling better soon. I help [place] out with telemedicine visits on Tuesdays and Thursdays and am available for visits on those days. If you have any concerns or questions following this visit please schedule a follow up visit with your Primary Care doctor or seek care at a local urgent care clinic to avoid delays in care.    Seek in person care or schedule a follow up video visit promptly if your symptoms worsen, new concerns arise or you are not improving with treatment. Call 911 and/or seek emergency care if your symptoms are severe or life threatening.

## 2020-08-24 ENCOUNTER — Other Ambulatory Visit: Payer: Self-pay

## 2020-08-24 ENCOUNTER — Encounter: Payer: Self-pay | Admitting: Registered Nurse

## 2020-08-24 ENCOUNTER — Telehealth (INDEPENDENT_AMBULATORY_CARE_PROVIDER_SITE_OTHER): Payer: BC Managed Care – PPO | Admitting: Registered Nurse

## 2020-08-24 DIAGNOSIS — R21 Rash and other nonspecific skin eruption: Secondary | ICD-10-CM | POA: Diagnosis not present

## 2020-08-24 DIAGNOSIS — U071 COVID-19: Secondary | ICD-10-CM

## 2020-08-24 MED ORDER — TRIAMCINOLONE ACETONIDE 0.1 % EX CREA: 1.0000 "application " | TOPICAL_CREAM | Freq: Two times a day (BID) | CUTANEOUS | 0 refills | Status: DC

## 2020-08-24 NOTE — Patient Instructions (Signed)
° ° ° °  If you have lab work done today you will be contacted with your lab results within the next 2 weeks.  If you have not heard from us then please contact us. The fastest way to get your results is to register for My Chart. ° ° °IF you received an x-ray today, you will receive an invoice from [facility]. Please contact [facility] at [phone] with questions or concerns regarding your invoice.  ° °IF you received labwork today, you will receive an invoice from [facility]. Please contact [facility] at [phone] with questions or concerns regarding your invoice.  ° °Our billing staff will not be able to assist you with questions regarding bills from these companies. ° °You will be contacted with the lab results as soon as they are available. The fastest way to get your results is to activate your My Chart account. Instructions are located on the last page of this paperwork. If you have not heard from us regarding the results in 2 weeks, please contact this office. °  ° ° ° °

## 2020-08-31 ENCOUNTER — Telehealth (INDEPENDENT_AMBULATORY_CARE_PROVIDER_SITE_OTHER): Payer: BC Managed Care – PPO | Admitting: Family Medicine

## 2020-08-31 ENCOUNTER — Encounter: Payer: Self-pay | Admitting: Family Medicine

## 2020-08-31 DIAGNOSIS — U071 COVID-19: Secondary | ICD-10-CM | POA: Diagnosis not present

## 2020-08-31 DIAGNOSIS — B369 Superficial mycosis, unspecified: Secondary | ICD-10-CM | POA: Diagnosis not present

## 2020-08-31 DIAGNOSIS — J40 Bronchitis, not specified as acute or chronic: Secondary | ICD-10-CM

## 2020-08-31 MED ORDER — PREDNISONE 10 MG PO TABS: ORAL_TABLET | ORAL | 0 refills | Status: DC

## 2020-08-31 MED ORDER — NYSTATIN 100000 UNIT/GM EX POWD: 1.0000 "application " | Freq: Three times a day (TID) | CUTANEOUS | 0 refills | Status: DC

## 2020-08-31 NOTE — Progress Notes (Signed)
Virtual Visit via Video   I connected with patient on 08/31/20 at  9:30 AM EDT by a video enabled telemedicine application and verified that I am speaking with the correct person using two identifiers.  Location patient: Home Location provider: [place], Office Persons participating in the virtual visit: Patient, Provider, [place] Noah Delaine H)  I discussed the limitations of evaluation and management by telemedicine and the availability of in person appointments. The patient expressed understanding and agreed to proceed.  Subjective:   HPI:   Bronchitis- sxs started 2 weeks ago after daughter visited.  Started w/ sore throat, fatigue and progressively worsened.  Tested + w/ home test.  Last week developed a rash.  Now 'I just can't kick my cough'  Is not currently using inhaler- 'albuterol makes me feel bad'.  Red patch- pt reports this developed last night.  Not itchy but 'sorta painful'.  'right under my tummy roll'  ROS:   See pertinent positives and negatives per HPI.  Patient Active Problem List   Diagnosis Date Noted  . Family history of early CAD 06/04/2017  . Vitamin D deficiency 11/30/2016  . Physical exam 05/05/2015  . Morbid obesity ([place]) 05/05/2015  . Arthritis of knee, degenerative 05/05/2015  . Pain and swelling of right lower leg 04/20/2015  . Trapezius muscle spasm 04/20/2015  . Right elbow pain 04/20/2015    Social History   Tobacco Use  . Smoking status: Former Research scientist (life sciences)  . Smokeless tobacco: Never Used  Substance Use Topics  . Alcohol use: No    Current Outpatient Medications:  .  Calcium Carbonate-Vitamin D (CALCIUM-D PO), Take 1 tablet by mouth daily., Disp: , Rfl:  .  levothyroxine (SYNTHROID) 50 MCG tablet, Take 1 tablet (50 mcg total) by mouth daily., Disp: 90 tablet, Rfl: 3 .  triamcinolone cream (KENALOG) 0.1 %, Apply 1 application topically 2 (two) times daily., Disp: 60 g, Rfl: 0 .  Vitamin D, Ergocalciferol, (DRISDOL) 1.25 MG (50000  UNIT) CAPS capsule, Take 1 capsule (50,000 Units total) by mouth every 7 (seven) days., Disp: 12 capsule, Rfl: 0 .  albuterol (VENTOLIN HFA) 108 (90 Base) MCG/ACT inhaler, Inhale 2 puffs into the lungs every 6 (six) hours as needed for wheezing or shortness of breath. (Patient not taking: Reported on 08/31/2020), Disp: 18 g, Rfl: 0  No Known Allergies  Objective:   LMP 10/20/2011   AAOx3, NAD NCAT, EOMI No obvious CN deficits Coloring WNL Pt is able to speak clearly, coherently without shortness of breath or increased work of breathing.  + dry hacking cough Thought process is linear.  Mood is appropriate.   Assessment and Plan:   COVID bronchitis- new.  Pt reports most of her COVID sxs have improved w/ the exception of her cough.  She is unable to speak or exert herself without triggering a coughing fit.  The only SOB she is experiencing comes during coughing.  She does not like to use her Albuterol inhaler as she states this makes her feel bad.  Will start Prednisone taper to improve airway inflammation and cough.  Pt expressed understanding and is in agreement w/ plan.   Fungal Dermatitis- new.  Pt not able to adjust phone to show me the area but based on the fact it is in a 'stomach fold', is 'angry red', and painful- I suspect this is a fungal issue.  Start topical nystatin powder.  Pt expressed understanding and is in agreement w/ plan.    Annye Asa, MD  08/31/2020  

## 2020-09-09 ENCOUNTER — Other Ambulatory Visit: Payer: Self-pay

## 2020-09-09 ENCOUNTER — Encounter: Payer: Self-pay | Admitting: Family Medicine

## 2020-09-09 ENCOUNTER — Ambulatory Visit: Payer: BC Managed Care – PPO | Admitting: Family Medicine

## 2020-09-09 VITALS — BP 130/80 | HR 69 | Temp 99.0°F | Resp 20 | Ht 67.0 in | Wt 271.4 lb

## 2020-09-09 DIAGNOSIS — U071 COVID-19: Secondary | ICD-10-CM | POA: Diagnosis not present

## 2020-09-09 DIAGNOSIS — E039 Hypothyroidism, unspecified: Secondary | ICD-10-CM | POA: Diagnosis not present

## 2020-09-09 HISTORY — DX: Hypothyroidism, unspecified: E03.9

## 2020-09-09 MED ORDER — PREDNISONE 10 MG PO TABS: ORAL_TABLET | ORAL | 0 refills | Status: DC

## 2020-09-09 NOTE — Patient Instructions (Addendum)
Schedule your complete physical in 6 months We'll notify you of your lab results and make any changes Continue to use the inhaler to improve cough and airway inflammation Ibuprofen 3x/day w/ food for airway inflammation AFTER prednisone taper EASE back into activity- your body needs to recover Call with any questions or concerns Hang in there!!!

## 2020-09-09 NOTE — Assessment & Plan Note (Signed)
Chronic problem.  On Levothyroxine 37mcg daily.  Difficult to determine if her fatigue is COVID related or thyroid related.  Check labs.  Adjust meds prn

## 2020-09-09 NOTE — Progress Notes (Signed)
   Subjective:    Patient ID: Patricia Adams, female    DOB: 04-04-1961, 60 y.o.   MRN: [id number]  HPI Hypothyroid- chronic problem, on Levothyroxine 44mcg daily.  + fatigue.  Obesity- pt has gained 4 lbs since last visit.  BMI now 42.51  No regular exercise, especially w/ recent COVID.  COVID- tested + 4 weeks ago but still having mild SOB w/ activity.  1st week had fever and cold.  2nd week had rash.  Last week was treated w/ Prednisone for ongoing cough/SOB.  Pt thought she was improving but continues to have symptoms.  'my chest feels tight- like my bra is too tight'.  Continues to have the urge to cough.  Elevated HR w/ activity.  No leg swelling or redness.  Used albuterol yesterday w/ some relief.   Review of Systems For ROS see HPI   This visit occurred during the SARS-CoV-2 public health emergency.  Safety protocols were in place, including screening questions prior to the visit, additional usage of staff PPE, and extensive cleaning of exam room while observing appropriate contact time as indicated for disinfecting solutions.       Objective:   Physical Exam Vitals reviewed.  Constitutional:      General: She is not in acute distress.    Appearance: Normal appearance. She is well-developed. She is obese.  HENT:     Head: Normocephalic and atraumatic.  Eyes:     Conjunctiva/sclera: Conjunctivae normal.     Pupils: Pupils are equal, round, and reactive to light.  Neck:     Thyroid: No thyromegaly.  Cardiovascular:     Rate and Rhythm: Normal rate and regular rhythm.     Heart sounds: Normal heart sounds. No murmur heard.   Pulmonary:     Effort: Pulmonary effort is normal. No respiratory distress.     Breath sounds: Normal breath sounds.     Comments: Dry, hacking cough Abdominal:     General: There is no distension.     Palpations: Abdomen is soft.     Tenderness: There is no abdominal tenderness.  Musculoskeletal:     Cervical back: Normal range of motion and  neck supple.  Lymphadenopathy:     Cervical: No cervical adenopathy.  Skin:    General: Skin is warm and dry.  Neurological:     Mental Status: She is alert and oriented to person, place, and time.  Psychiatric:        Behavior: Behavior normal.           Assessment & Plan:  COVID- pt continues to recover.  Still has slight SOB w/ minimal exertion.  Felt better when on the prednisone taper.  Will repeat at this time.  Encouraged albuterol use.  Reviewed supportive care and red flags that should prompt return.  Pt expressed understanding and is in agreement w/ plan.

## 2020-09-09 NOTE — Assessment & Plan Note (Signed)
Ongoing issue for pt.  BMI 42.51  She has not been able to exercise recently due to [place].  Encouraged healthy diet and to ease back into physical activity.  Will follow.

## 2020-09-10 LAB — TSH: TSH: 2.34 u[IU]/mL (ref 0.35–4.50)

## 2020-09-10 LAB — LDL CHOLESTEROL, DIRECT: Direct LDL: 50 mg/dL

## 2020-09-10 LAB — LIPID PANEL
Cholesterol: 187 mg/dL (ref 0–200)
HDL: 65.2 mg/dL (ref >39.00)
NonHDL: 122.26
Total CHOL/HDL Ratio: 3
Triglycerides: 241 mg/dL — ABNORMAL HIGH (ref 0.0–149.0)
VLDL: 48.2 mg/dL — ABNORMAL HIGH (ref 0.0–40.0)

## 2020-09-10 LAB — CBC WITH DIFFERENTIAL/PLATELET
Basophils Absolute: 0 10*3/uL (ref 0.0–0.1)
Basophils Relative: 0.4 % (ref 0.0–3.0)
Eosinophils Absolute: 0.1 10*3/uL (ref 0.0–0.7)
Eosinophils Relative: 1.7 % (ref 0.0–5.0)
HCT: 35.2 % — ABNORMAL LOW (ref 36.0–46.0)
Hemoglobin: 11.9 g/dL — ABNORMAL LOW (ref 12.0–15.0)
Lymphocytes Relative: 28 % (ref 12.0–46.0)
Lymphs Abs: 2.2 10*3/uL (ref 0.7–4.0)
MCHC: 33.9 g/dL (ref 30.0–36.0)
MCV: 85.9 fl (ref 78.0–100.0)
Monocytes Absolute: 0 10*3/uL — ABNORMAL LOW (ref 0.1–1.0)
Monocytes Relative: 0.5 % — ABNORMAL LOW (ref 3.0–12.0)
Neutro Abs: 5.6 10*3/uL (ref 1.4–7.7)
Neutrophils Relative %: 69.4 % (ref 43.0–77.0)
Platelets: 299 10*3/uL (ref 150.0–400.0)
RBC: 4.1 Mil/uL (ref 3.87–5.11)
RDW: 14.6 % (ref 11.5–15.5)
WBC: 8 10*3/uL (ref 4.0–10.5)

## 2020-09-10 LAB — HEPATIC FUNCTION PANEL
ALT: 11 U/L (ref 0–35)
AST: 10 U/L (ref 0–37)
Albumin: 4 g/dL (ref 3.5–5.2)
Alkaline Phosphatase: 91 U/L (ref 39–117)
Bilirubin, Direct: 0.1 mg/dL (ref 0.0–0.3)
Total Bilirubin: 0.4 mg/dL (ref 0.2–1.2)
Total Protein: 6.5 g/dL (ref 6.0–8.3)

## 2020-09-10 LAB — BASIC METABOLIC PANEL
BUN: 13 mg/dL (ref 6–23)
CO2: 27 mEq/L (ref 19–32)
Calcium: 9 mg/dL (ref 8.4–10.5)
Chloride: 105 mEq/L (ref 96–112)
Creatinine, Ser: 1.03 mg/dL (ref 0.40–1.20)
GFR: 59.84 mL/min — ABNORMAL LOW (ref >60.00)
Glucose, Bld: 94 mg/dL (ref 70–99)
Potassium: 3.8 mEq/L (ref 3.5–5.1)
Sodium: 141 mEq/L (ref 135–145)

## 2020-10-20 ENCOUNTER — Encounter: Payer: Self-pay | Admitting: *Deleted

## 2020-12-09 ENCOUNTER — Other Ambulatory Visit: Payer: Self-pay | Admitting: Family Medicine

## 2021-02-13 NOTE — Progress Notes (Signed)
Telemedicine Encounter- SOAP NOTE Established Patient  This telephone encounter was conducted with the patient's (or proxy's) verbal consent via audio telecommunications: yes/no: Yes Patient was instructed to have this encounter in a suitably private space; and to only have persons present to whom they give permission to participate. In addition, patient identity was confirmed by use of name plus two identifiers (DOB and address).  I discussed the limitations, risks, security and privacy concerns of performing an evaluation and management service by telephone and the availability of in person appointments. I also discussed with the patient that there may be a patient responsible charge related to this service. The patient expressed understanding and agreed to proceed.  I spent a total of 14 minutes talking with the patient or their proxy.  Patient at home Provider in office  Participants: Kathrin Ruddy, NP and Eli Hose  Chief Complaint  Patient presents with   Rash    Patient states she woke up with a rash under arms and torso at 4 am patient states she does not know if its due to medication.    Subjective   Patricia Adams is a 60 y.o. established patient. Telephone visit today for rash  HPI Acute onset overnight last night. Mostly on underarms and torso, into axilla.  No changes to lifestyle habits, only change notable for recent COVID-19 infection for which she has been taking molnupiravir for the past 4 days. Rash does not peel or form vesicles. No involvement of mucus membranes. No pain  Otherwise no concerns. Notes covid symptoms improving.   Patient Active Problem List   Diagnosis Date Noted   Hypothyroid 09/09/2020   Family history of early CAD 06/04/2017   Vitamin D deficiency 11/30/2016   Physical exam 05/05/2015   Morbid obesity ([place]) 05/05/2015   Arthritis of knee, degenerative 05/05/2015   Pain and swelling of right lower leg 04/20/2015   Trapezius  muscle spasm 04/20/2015   Right elbow pain 04/20/2015    Past Medical History:  Diagnosis Date   Arthritis    Asthma    Situational asthma   Headache    History of chicken pox    Hypothyroid 09/09/2020   Neuromuscular disorder (HCC)     Current Outpatient Medications  Medication Sig Dispense Refill   albuterol (VENTOLIN HFA) 108 (90 Base) MCG/ACT inhaler Inhale 2 puffs into the lungs every 6 (six) hours as needed for wheezing or shortness of breath. 18 g 0   Calcium Carbonate-Vitamin D (CALCIUM-D PO) Take 1 tablet by mouth daily.     Vitamin D, Ergocalciferol, (DRISDOL) 1.25 MG (50000 UNIT) CAPS capsule Take 1 capsule (50,000 Units total) by mouth every 7 (seven) days. 12 capsule 0   levothyroxine (SYNTHROID) 50 MCG tablet TAKE 1 TABLET(50 MCG) BY MOUTH DAILY 90 tablet 1   predniSONE (DELTASONE) 10 MG tablet 3 tabs x3 days and then 2 tabs x3 days and then 1 tab x3 days.  Take w/ food. 18 tablet 0   No current facility-administered medications for this visit.    No Known Allergies  Social History   Socioeconomic History   Marital status: Married    Spouse name: Not on file   Number of children: Not on file   Years of education: Not on file   Highest education level: Not on file  Occupational History   Not on file  Tobacco Use   Smoking status: Former   Smokeless tobacco: Never  Vaping Use   Vaping Use:  Never used  Substance and Sexual Activity   Alcohol use: No   Drug use: No   Sexual activity: Not on file  Other Topics Concern   Not on file  Social History Narrative   Not on file   Social Determinants of Health   Financial Resource Strain: Not on file  Food Insecurity: Not on file  Transportation Needs: Not on file  Physical Activity: Not on file  Stress: Not on file  Social Connections: Not on file  Intimate Partner Violence: Not on file    ROS Per hpi  Objective   Vitals as reported by the patient: There were no vitals filed for this  visit.  [name] was seen today for rash.  Diagnoses and all orders for this visit:  Rash associated with COVID-19 -     Discontinue: triamcinolone cream (KENALOG) 0.1 %; Apply 1 application topically 2 (two) times daily. (Patient not taking: Reported on 09/09/2020)   PLAN Recommend she stop molnupiravir. Triamcinolone for spot relief on rash. If persistent or worsening should present in office for evaluation. Can consider prednisone taper. ER precautions reviewed. Pt voices understanding Patient encouraged to call clinic with any questions, comments, or concerns.  I discussed the assessment and treatment plan with the patient. The patient was provided an opportunity to ask questions and all were answered. The patient agreed with the plan and demonstrated an understanding of the instructions.   The patient was advised to call back or seek an in-person evaluation if the symptoms worsen or if the condition fails to improve as anticipated.  I provided 14 minutes of non-face-to-face time during this encounter.  Maximiano Coss, NP

## 2021-03-10 ENCOUNTER — Encounter: Payer: BC Managed Care – PPO | Admitting: Family Medicine

## 2021-04-01 DIAGNOSIS — R0789 Other chest pain: Secondary | ICD-10-CM | POA: Diagnosis not present

## 2021-04-01 DIAGNOSIS — R091 Pleurisy: Secondary | ICD-10-CM | POA: Diagnosis not present

## 2021-04-11 DIAGNOSIS — L821 Other seborrheic keratosis: Secondary | ICD-10-CM | POA: Diagnosis not present

## 2021-04-11 DIAGNOSIS — L814 Other melanin hyperpigmentation: Secondary | ICD-10-CM | POA: Diagnosis not present

## 2021-04-11 DIAGNOSIS — D1801 Hemangioma of skin and subcutaneous tissue: Secondary | ICD-10-CM | POA: Diagnosis not present

## 2021-04-11 DIAGNOSIS — L578 Other skin changes due to chronic exposure to nonionizing radiation: Secondary | ICD-10-CM | POA: Diagnosis not present

## 2021-04-27 ENCOUNTER — Other Ambulatory Visit: Payer: Self-pay | Admitting: Obstetrics and Gynecology

## 2021-04-27 DIAGNOSIS — Z1231 Encounter for screening mammogram for malignant neoplasm of breast: Secondary | ICD-10-CM

## 2021-05-11 ENCOUNTER — Other Ambulatory Visit: Payer: Self-pay | Admitting: Family Medicine

## 2021-06-07 ENCOUNTER — Ambulatory Visit
Admission: RE | Admit: 2021-06-07 | Discharge: 2021-06-07 | Disposition: A | Discharge status: Home / Self Care | Payer: BC Managed Care – PPO | Source: Ambulatory Visit | Attending: Obstetrics and Gynecology | Admitting: Obstetrics and Gynecology

## 2021-06-07 DIAGNOSIS — Z1231 Encounter for screening mammogram for malignant neoplasm of breast: Secondary | ICD-10-CM | POA: Diagnosis not present

## 2021-06-07 DIAGNOSIS — Z01419 Encounter for gynecological examination (general) (routine) without abnormal findings: Secondary | ICD-10-CM | POA: Diagnosis not present

## 2021-06-07 LAB — HM MAMMOGRAPHY

## 2021-06-09 ENCOUNTER — Encounter: Payer: Self-pay | Admitting: Family Medicine

## 2021-06-09 ENCOUNTER — Ambulatory Visit (INDEPENDENT_AMBULATORY_CARE_PROVIDER_SITE_OTHER): Payer: BC Managed Care – PPO | Admitting: Family Medicine

## 2021-06-09 ENCOUNTER — Telehealth: Payer: Self-pay

## 2021-06-09 VITALS — BP 112/72 | HR 76 | Temp 98.2°F | Resp 16 | Ht 67.0 in | Wt 269.0 lb

## 2021-06-09 DIAGNOSIS — Z0000 Encounter for general adult medical examination without abnormal findings: Secondary | ICD-10-CM

## 2021-06-09 DIAGNOSIS — Z114 Encounter for screening for human immunodeficiency virus [HIV]: Secondary | ICD-10-CM | POA: Diagnosis not present

## 2021-06-09 DIAGNOSIS — E559 Vitamin D deficiency, unspecified: Secondary | ICD-10-CM | POA: Diagnosis not present

## 2021-06-09 DIAGNOSIS — C44310 Basal cell carcinoma of skin of unspecified parts of face: Secondary | ICD-10-CM | POA: Insufficient documentation

## 2021-06-09 DIAGNOSIS — Z23 Encounter for immunization: Secondary | ICD-10-CM | POA: Diagnosis not present

## 2021-06-09 DIAGNOSIS — Z1159 Encounter for screening for other viral diseases: Secondary | ICD-10-CM | POA: Diagnosis not present

## 2021-06-09 DIAGNOSIS — L719 Rosacea, unspecified: Secondary | ICD-10-CM | POA: Insufficient documentation

## 2021-06-09 DIAGNOSIS — Z85828 Personal history of other malignant neoplasm of skin: Secondary | ICD-10-CM | POA: Insufficient documentation

## 2021-06-09 HISTORY — DX: Basal cell carcinoma of skin of unspecified parts of face: C44.310

## 2021-06-09 LAB — CBC WITH DIFFERENTIAL/PLATELET
Basophils Absolute: 0 10*3/uL (ref 0.0–0.1)
Basophils Relative: 0.5 % (ref 0.0–3.0)
Eosinophils Absolute: 0.1 10*3/uL (ref 0.0–0.7)
Eosinophils Relative: 1.8 % (ref 0.0–5.0)
HCT: 34.9 % — ABNORMAL LOW (ref 36.0–46.0)
Hemoglobin: 11.4 g/dL — ABNORMAL LOW (ref 12.0–15.0)
Lymphocytes Relative: 26.1 % (ref 12.0–46.0)
Lymphs Abs: 1.2 10*3/uL (ref 0.7–4.0)
MCHC: 32.7 g/dL (ref 30.0–36.0)
MCV: 83.4 fl (ref 78.0–100.0)
Monocytes Absolute: 0.5 10*3/uL (ref 0.1–1.0)
Monocytes Relative: 9.9 % (ref 3.0–12.0)
Neutro Abs: 2.8 10*3/uL (ref 1.4–7.7)
Neutrophils Relative %: 61.7 % (ref 43.0–77.0)
Platelets: 269 10*3/uL (ref 150.0–400.0)
RBC: 4.19 Mil/uL (ref 3.87–5.11)
RDW: 16.1 % — ABNORMAL HIGH (ref 11.5–15.5)
WBC: 4.6 10*3/uL (ref 4.0–10.5)

## 2021-06-09 LAB — TSH: TSH: 2.79 u[IU]/mL (ref 0.35–5.50)

## 2021-06-09 LAB — BASIC METABOLIC PANEL
BUN: 13 mg/dL (ref 6–23)
CO2: 29 mEq/L (ref 19–32)
Calcium: 9.4 mg/dL (ref 8.4–10.5)
Chloride: 105 mEq/L (ref 96–112)
Creatinine, Ser: 0.9 mg/dL (ref 0.40–1.20)
GFR: 69.99 mL/min (ref >60.00)
Glucose, Bld: 100 mg/dL — ABNORMAL HIGH (ref 70–99)
Potassium: 4 mEq/L (ref 3.5–5.1)
Sodium: 139 mEq/L (ref 135–145)

## 2021-06-09 LAB — HEPATIC FUNCTION PANEL
ALT: 12 U/L (ref 0–35)
AST: 13 U/L (ref 0–37)
Albumin: 4.1 g/dL (ref 3.5–5.2)
Alkaline Phosphatase: 98 U/L (ref 39–117)
Bilirubin, Direct: 0.1 mg/dL (ref 0.0–0.3)
Total Bilirubin: 0.4 mg/dL (ref 0.2–1.2)
Total Protein: 6.5 g/dL (ref 6.0–8.3)

## 2021-06-09 LAB — LIPID PANEL
Cholesterol: 189 mg/dL (ref 0–200)
HDL: 58.9 mg/dL (ref >39.00)
NonHDL: 130.3
Total CHOL/HDL Ratio: 3
Triglycerides: 208 mg/dL — ABNORMAL HIGH (ref 0.0–149.0)
VLDL: 41.6 mg/dL — ABNORMAL HIGH (ref 0.0–40.0)

## 2021-06-09 LAB — LDL CHOLESTEROL, DIRECT: Direct LDL: 51 mg/dL

## 2021-06-09 LAB — VITAMIN D 25 HYDROXY (VIT D DEFICIENCY, FRACTURES): VITD: 34.3 ng/mL (ref 30.00–100.00)

## 2021-06-09 NOTE — Assessment & Plan Note (Signed)
Pt's BMI is 42.13  Encouraged healthy diet and regular exercise.  Check labs to risk stratify.  Will follow.

## 2021-06-09 NOTE — Telephone Encounter (Signed)
Advised he speak with his own PCP as it sounds as though he is a rare exception to what is typically seen with shingles and chicken pox. She agreed states she will get him to establish

## 2021-06-09 NOTE — Progress Notes (Signed)
° °  Subjective:    Patient ID: Patricia Adams, female    DOB: 11-17-1960, 61 y.o.   MRN: [id number]  HPI CPE- UTD on pap, mammo, colonoscopy, Tdap, flu.    Patient Care Team    Relationship Specialty Notifications Start End  Midge Minium, MD PCP - General Family Medicine  04/20/15   Bobbye Charleston, MD Consulting Physician Obstetrics and Gynecology  04/20/15   Juanita Craver, MD Consulting Physician Gastroenterology  04/20/15      Health Maintenance  Topic Date Due   HIV Screening  Never done   Hepatitis C Screening  Never done   Zoster Vaccines- Shingrix (1 of 2) Never done   COVID-19 Vaccine (2 - Pfizer risk series) 01/04/2020   PAP SMEAR-Modifier  03/20/2021   MAMMOGRAM  06/08/2023   COLONOSCOPY (Pts 45-16yrs Insurance coverage will need to be confirmed)  01/22/2025   TETANUS/TDAP  04/05/2025   INFLUENZA VACCINE  Completed   HPV VACCINES  Aged Out     Review of Systems Patient reports no vision/ hearing changes, adenopathy,fever, weight change,  persistant/recurrent hoarseness , swallowing issues, chest pain, palpitations, edema, persistant/recurrent cough, hemoptysis, dyspnea (rest/exertional/paroxysmal nocturnal), gastrointestinal bleeding (melena, rectal bleeding), abdominal pain, significant heartburn, GU symptoms (dysuria, hematuria, incontinence), Gyn symptoms (abnormal  bleeding, pain),  syncope, focal weakness, memory loss, numbness & tingling, skin/hair/nail changes, abnormal bruising or bleeding, anxiety, or depression.   + constipation  This visit occurred during the SARS-CoV-2 public health emergency.  Safety protocols were in place, including screening questions prior to the visit, additional usage of staff PPE, and extensive cleaning of exam room while observing appropriate contact time as indicated for disinfecting solutions.      Objective:   Physical Exam General Appearance:    Alert, cooperative, no distress, appears stated age, obese  Head:     Normocephalic, without obvious abnormality, atraumatic  Eyes:    PERRL, conjunctiva/corneas clear, EOM's intact, fundi    benign, both eyes  Ears:    Normal TM's and external ear canals, both ears  Nose:   Deferred due to COVID  Throat:   Neck:   Supple, symmetrical, trachea midline, no adenopathy;    Thyroid: no enlargement/tenderness/nodules  Back:     Symmetric, no curvature, ROM normal, no CVA tenderness  Lungs:     Clear to auscultation bilaterally, respirations unlabored  Chest Wall:    No tenderness or deformity   Heart:    Regular rate and rhythm, S1 and S2 normal, no murmur, rub   or gallop  Breast Exam:    Deferred to GYN  Abdomen:     Soft, non-tender, bowel sounds active all four quadrants,    no masses, no organomegaly  Genitalia:    Deferred to GYN  Rectal:    Extremities:   Extremities normal, atraumatic, no cyanosis or edema  Pulses:   2+ and symmetric all extremities  Skin:   Skin color, texture, turgor normal, no rashes or lesions  Lymph nodes:   Cervical, supraclavicular, and axillary nodes normal  Neurologic:   CNII-XII intact, normal strength, sensation and reflexes    throughout          Assessment & Plan:

## 2021-06-09 NOTE — Assessment & Plan Note (Signed)
Check levels and replete prn.

## 2021-06-09 NOTE — Addendum Note (Signed)
Addended by: Juliann Pulse on: 06/09/2021 08:43 AM   Modules accepted: Orders

## 2021-06-09 NOTE — Patient Instructions (Addendum)
Follow up in 1 year or as needed Schedule a nurse visit in 2-6 months for the 2nd shingles shot We'll notify you of your lab results and make any changes if needed Continue to work on healthy diet and regular exercise- you can do it! Call with any questions or concerns Stay Safe!  Stay Healthy! Happy Spring!!

## 2021-06-09 NOTE — Telephone Encounter (Signed)
Pt had a general question about her son he is not a patient and she said when he was 91 months old he got chicken pox, at 8 he got shingles, he is now 13. Do you recommend him or can he even get a shingle shot at age 61?

## 2021-06-09 NOTE — Assessment & Plan Note (Addendum)
Pt's PE WNL w/ exception of obesity.  UTD on pap, mammo, colonoscopy, Tdap, flu. Shingrix given today. Check labs.  Anticipatory guidance provided.

## 2021-06-10 ENCOUNTER — Telehealth: Payer: Self-pay

## 2021-06-10 LAB — HEPATITIS C ANTIBODY
Hepatitis C Ab: NONREACTIVE
SIGNAL TO CUT-OFF: <0.02 (ref <1.00)

## 2021-06-10 LAB — HIV ANTIBODY (ROUTINE TESTING W REFLEX): HIV 1&2 Ab, 4th Generation: NONREACTIVE

## 2021-06-10 NOTE — Telephone Encounter (Signed)
-----   Message from Midge Minium, MD sent at 06/10/2021  7:27 AM EST ----- Labs look great!  Continue to work on healthy diet and regular exercise.  No med changes at this time

## 2021-06-10 NOTE — Telephone Encounter (Signed)
Pt aware of labs  

## 2021-07-27 ENCOUNTER — Telehealth: Payer: Self-pay

## 2021-07-27 ENCOUNTER — Ambulatory Visit: Payer: BC Managed Care – PPO | Admitting: Family Medicine

## 2021-07-27 ENCOUNTER — Ambulatory Visit: Payer: BC Managed Care – PPO | Admitting: Physician Assistant

## 2021-07-27 ENCOUNTER — Other Ambulatory Visit: Payer: Self-pay

## 2021-07-27 ENCOUNTER — Emergency Department (HOSPITAL_BASED_OUTPATIENT_CLINIC_OR_DEPARTMENT_OTHER)
Admission: EM | Admit: 2021-07-27 | Discharge: 2021-07-27 | Disposition: A | Discharge status: Home / Self Care | Payer: BC Managed Care – PPO | Attending: Emergency Medicine | Admitting: Emergency Medicine

## 2021-07-27 ENCOUNTER — Encounter (HOSPITAL_BASED_OUTPATIENT_CLINIC_OR_DEPARTMENT_OTHER): Payer: Self-pay | Admitting: Emergency Medicine

## 2021-07-27 ENCOUNTER — Emergency Department (HOSPITAL_BASED_OUTPATIENT_CLINIC_OR_DEPARTMENT_OTHER): Payer: BC Managed Care – PPO

## 2021-07-27 DIAGNOSIS — R110 Nausea: Secondary | ICD-10-CM | POA: Insufficient documentation

## 2021-07-27 DIAGNOSIS — R1030 Lower abdominal pain, unspecified: Secondary | ICD-10-CM | POA: Insufficient documentation

## 2021-07-27 LAB — COMPREHENSIVE METABOLIC PANEL
ALT: 15 U/L (ref 0–44)
AST: 15 U/L (ref 15–41)
Albumin: 4.5 g/dL (ref 3.5–5.0)
Alkaline Phosphatase: 94 U/L (ref 38–126)
Anion gap: 6 (ref 5–15)
BUN: 7 mg/dL (ref 6–20)
CO2: 29 mmol/L (ref 22–32)
Calcium: 9.9 mg/dL (ref 8.9–10.3)
Chloride: 105 mmol/L (ref 98–111)
Creatinine, Ser: 0.87 mg/dL (ref 0.44–1.00)
GFR, Estimated: >60 mL/min (ref >60)
Glucose, Bld: 107 mg/dL — ABNORMAL HIGH (ref 70–99)
Potassium: 3.9 mmol/L (ref 3.5–5.1)
Sodium: 140 mmol/L (ref 135–145)
Total Bilirubin: 0.5 mg/dL (ref 0.3–1.2)
Total Protein: 7.1 g/dL (ref 6.5–8.1)

## 2021-07-27 LAB — URINALYSIS, ROUTINE W REFLEX MICROSCOPIC
Bilirubin Urine: NEGATIVE
Glucose, UA: NEGATIVE mg/dL
Hgb urine dipstick: NEGATIVE
Ketones, ur: NEGATIVE mg/dL
Leukocytes,Ua: NEGATIVE
Nitrite: NEGATIVE
Protein, ur: NEGATIVE mg/dL
Specific Gravity, Urine: 1.01 (ref 1.005–1.030)
pH: 6 (ref 5.0–8.0)

## 2021-07-27 LAB — OCCULT BLOOD X 1 CARD TO LAB, STOOL: Fecal Occult Bld: NEGATIVE

## 2021-07-27 LAB — PREGNANCY, URINE: Preg Test, Ur: NEGATIVE

## 2021-07-27 LAB — CBC
HCT: 32 % — ABNORMAL LOW (ref 36.0–46.0)
Hemoglobin: 10.2 g/dL — ABNORMAL LOW (ref 12.0–15.0)
MCH: 27.3 pg (ref 26.0–34.0)
MCHC: 31.9 g/dL (ref 30.0–36.0)
MCV: 85.6 fL (ref 80.0–100.0)
Platelets: 302 10*3/uL (ref 150–400)
RBC: 3.74 MIL/uL — ABNORMAL LOW (ref 3.87–5.11)
RDW: 14.7 % (ref 11.5–15.5)
WBC: 5 10*3/uL (ref 4.0–10.5)
nRBC: 0 % (ref 0.0–0.2)

## 2021-07-27 LAB — LIPASE, BLOOD: Lipase: 39 U/L (ref 11–51)

## 2021-07-27 MED ORDER — IOHEXOL 300 MG/ML  SOLN
100.0000 mL | Freq: Once | INTRAMUSCULAR | Status: AC | PRN
  Administered 2021-07-27: 100 mL via INTRAVENOUS

## 2021-07-27 MED ORDER — ONDANSETRON HCL 4 MG/2ML IJ SOLN
4.0000 mg | Freq: Once | INTRAMUSCULAR | Status: AC
  Administered 2021-07-27: 4 mg via INTRAVENOUS
  Filled 2021-07-27: qty 2

## 2021-07-27 NOTE — Discharge Instructions (Signed)
Your workup was overall reassuring in the ED today without acute findings. It is recommended that you take Miralax daily, increase the fiber in your diet, and drink plenty of water to stay hydrated as mild constipation could be playing a role in your pain.  ? ?Follow up with your PCP for further eval. ? ?Return to the ED for any new/worsening symptoms ?

## 2021-07-27 NOTE — Telephone Encounter (Signed)
Patient called back and states she will go to [facility].  ? ?Nurse Assessment ?Nurse: Raphael Gibney, RN, Vera Date/Time Eilene Ghazi Time): 07/27/2021 12:16:03 PM ?Confirm and document reason for call. If symptomatic, describe symptoms. ?---Caller states she had reddish brown stool on Monday. has been having abd pain since a week ago on Monday. has lower abd pain on both sides. Pain is intermittent. pain level 3-4. Sometimes it goes up to 6-7 ? ?Does the patient have any new or worsening symptoms? ---Yes ?Will a triage be completed? ---Yes ?Related visit to physician within the last 2 weeks? ---No ?Does the PT have any chronic conditions? (i.e. diabetes, asthma, this includes High risk factors for pregnancy, etc.) ---Yes ?List chronic conditions. ---thyroid disease ?Is this a behavioral health or substance abuse call? ---No ? ?Abdominal Pain - Female ?Blood in bowel movements ?(Exception: blood on surface of BM with constipation) ?Raphael Gibney, RN, Vera 07/27/2021 12:20:16 PM ? ?07/27/2021 12:26:25 PM Go to ED Now Yes Raphael Gibney, RN, Vanita Ingles ?

## 2021-07-27 NOTE — ED Notes (Signed)
Patient transported to CT 

## 2021-07-27 NOTE — ED Triage Notes (Signed)
Lower abdominal pain x 10 days , no nausea or vomiting , no diarrhea .  ?Change in BM routine . Last BM  today. Had  to take laxatives but not passing much per pt  ?

## 2021-07-27 NOTE — ED Provider Notes (Signed)
?[facility] ?Provider Note ? ? ?CSN: [id number] ?Arrival date & time: 07/27/21  1400 ? ?  ? ?History ? ?Chief Complaint  ?Patient presents with  ? Abdominal Pain  ? ? ?Patricia Adams is a 61 y.o. female who presents to the ED today with complaint of gradual onset, intermittent, diffuse lower abdominal pain for the past 10 days.  Patient also complains of mild nausea.  She denies any recent sick contacts.  She states that she has been following a bland diet over the past few days.  She was able to drink some tomato soup a couple of days ago however the next day noticed that she had bright red blood per rectum.  She is unsure if this was the tomato soup or actual blood.  She does endorse that she was mildly constipated and had been taking MiraLAX with success.  She denies any diarrhea.  Denies any previous abdominal surgeries. Denies any recent sick contacts.  ? ?The history is provided by the patient and medical records.  ? ?  ? ?Home Medications ?Prior to Admission medications   ?Medication Sig Start Date End Date Taking? Authorizing Provider  ?Calcium Carbonate-Vitamin D (CALCIUM-D PO) Take 1 tablet by mouth daily.    [provider]  ?levothyroxine (SYNTHROID) 50 MCG tablet TAKE 1 TABLET(50 MCG) BY MOUTH DAILY 05/11/21   Midge Minium, MD  ?Vitamin D, Ergocalciferol, (DRISDOL) 1.25 MG (50000 UNIT) CAPS capsule Take 1 capsule (50,000 Units total) by mouth every 7 (seven) days. 12/18/19   Midge Minium, MD  ?   ? ?Allergies    ?Patient has no known allergies.   ? ?Review of Systems   ?Review of Systems  ?Constitutional:  Negative for chills and fever.  ?Gastrointestinal:  Positive for abdominal pain and nausea. Negative for diarrhea and vomiting.  ?Genitourinary:  Negative for dysuria and frequency.  ?All other systems reviewed and are negative. ? ?Physical Exam ?Updated Vital Signs ?BP (!) 138/49 (BP Location: Left Arm)   Pulse 71   Temp 98.3 ?F (36.8 ?C) (Oral)    Resp 18   Ht '5\' 7"'$  (1.702 m)   Wt 120.2 kg   LMP 10/20/2011   SpO2 100%   BMI 41.50 kg/m?  ?Physical Exam ?Vitals and nursing note reviewed.  ?Constitutional:   ?   Appearance: She is not ill-appearing or diaphoretic.  ?HENT:  ?   Head: Normocephalic and atraumatic.  ?Eyes:  ?   Conjunctiva/sclera: Conjunctivae normal.  ?Cardiovascular:  ?   Rate and Rhythm: Normal rate and regular rhythm.  ?Pulmonary:  ?   Effort: Pulmonary effort is normal.  ?   Breath sounds: Normal breath sounds.  ?Abdominal:  ?   Palpations: Abdomen is soft.  ?   Tenderness: There is abdominal tenderness.  ?   Comments: Soft, mild diffuse lower abd TTP, +BS throughout, no r/g/r, neg murphy's, neg mcburney's, no CVA TTP  ?Musculoskeletal:  ?   Cervical back: Neck supple.  ?Skin: ?   General: Skin is warm and dry.  ?Neurological:  ?   Mental Status: She is alert.  ? ? ?ED Results / Procedures / Treatments   ?Labs ?(all labs ordered are listed, but only abnormal results are displayed) ?Labs Reviewed  ?COMPREHENSIVE METABOLIC PANEL - Abnormal; Notable for the following components:  ?    Result Value  ? Glucose, Bld 107 (*)   ? All other components within normal limits  ?CBC - Abnormal; Notable for  the following components:  ? RBC 3.74 (*)   ? Hemoglobin 10.2 (*)   ? HCT 32.0 (*)   ? All other components within normal limits  ?URINALYSIS, ROUTINE W REFLEX MICROSCOPIC - Abnormal; Notable for the following components:  ? Color, Urine STRAW (*)   ? All other components within normal limits  ?LIPASE, BLOOD  ?PREGNANCY, URINE  ?OCCULT BLOOD X 1 CARD TO LAB, STOOL  ? ? ?EKG ?None ? ?Radiology ?CT Abdomen Pelvis W Contrast ? ?Result Date: 07/27/2021 ?CLINICAL DATA:  Lower abdominal pain EXAM: CT ABDOMEN AND PELVIS WITH CONTRAST TECHNIQUE: Multidetector CT imaging of the abdomen and pelvis was performed using the standard protocol following bolus administration of intravenous contrast. RADIATION DOSE REDUCTION: This exam was performed according to the  departmental dose-optimization program which includes automated exposure control, adjustment of the mA and/or kV according to patient size and/or use of iterative reconstruction technique. CONTRAST:  141m OMNIPAQUE IOHEXOL 300 MG/ML  SOLN COMPARISON:  11/21/2019 FINDINGS: Lower chest: No focal pulmonary opacity or pleural effusion. No pericardial effusion. Hepatobiliary: No focal liver abnormality is seen. No gallstones, gallbladder wall thickening, or biliary dilatation. Pancreas: Unremarkable. No pancreatic ductal dilatation or surrounding inflammatory changes. Spleen: Normal in size without focal abnormality. Adrenals/Urinary Tract: Redemonstrated right adrenal gland nodule, which was found to be compatible with a benign adenoma on the prior CT. The left adrenal gland is unremarkable. The kidneys enhance symmetrically with no hydronephrosis or nephrolithiasis. The bladder is quite distended but otherwise unremarkable. Stomach/Bowel: Stomach is within normal limits. Appendix appears normal. No evidence of bowel wall thickening, distention, or inflammatory changes. Vascular/Lymphatic: No significant vascular findings are present. No enlarged abdominal or pelvic lymph nodes. Reproductive: Uterus and bilateral adnexa are unremarkable. Other: No abdominal wall hernia or abnormality. No abdominopelvic ascites. Musculoskeletal: No acute osseous abnormality. IMPRESSION: No acute process in the abdomen or pelvis. Electronically Signed   By: AMerilyn BabaM.D.   On: 07/27/2021 17:21   ? ?Procedures ?Procedures  ? ? ?Medications Ordered in ED ?Medications  ?ondansetron (ZOFRAN) injection 4 mg (4 mg Intravenous Given 07/27/21 1610)  ?iohexol (OMNIPAQUE) 300 MG/ML solution 100 mL (100 mLs Intravenous Contrast Given 07/27/21 1652)  ? ? ?ED Course/ Medical Decision Making/ A&P ?  ?                        ?Medical Decision Making ?61year old female presents to the ED today with complaint of intermittent lower abdominal pain for  the past 10 days with associated nausea.  On arrival to the ED today patient is afebrile, nontachycardic and nontachypneic and appears to be in no acute distress.  Labs been collected including CBC, CMP, lipase, urinalysis, urine pregnancy test.  Patient is 61years old and has not had a menstrual cycle in several years.  I do not suspect pregnancy at this time however unable to cancel lab.  ? ?CBC has returned without leukocytosis.  Hemoglobin is down at 10.2.  1 month ago at 11.4.  She does endorse bright red blood per rectum however we will plan for rectal exam at this time for further evaluation.  Question possibility of diverticular bleed.  ?CMP without electrolyte abnormalities.  LFTs unremarkable. ?Lipase currently pending as well as urinalysis and urine pregnancy test. ? ?We will plan for CT scan for further evaluation given lower abdominal pain for the past 10 days.  Patient states her pain is currently very mild and does not require any pain  medication however we will continue to reevaluate.  ? ?CT scan negative, no acute findings.  Remainder of blood work reassuring.  Have recommended PCP follow-up for further evaluation.  If pain continues patient may require GI referral.  She reports that she is due for a colonoscopy in the near future.  Otherwise stable for discharge home at this time.  Recommend MiraLAX, increasing fiber, increasing fluids as constipation may be causing some of her symptoms.  She is in agreement with plan and stable for discharge. ? ?Problems Addressed: ?Lower abdominal pain: acute illness or injury ? ?Amount and/or Complexity of Data Reviewed ?Labs: ordered. ?   Details: Fecal occult negative ?UPT negative ?U/A without signs of infection ?Radiology: ordered. ?   Details: CT: ?IMPRESSION:  ?No acute process in the abdomen or pelvis ? ?Risk ?Prescription drug management. ? ? ? ? ? ? ? ? ? ?Final Clinical Impression(s) / ED Diagnoses ?Final diagnoses:  ?Lower abdominal pain  ? ? ?Rx /  DC Orders ?ED Discharge Orders   ? ? None  ? ?  ? ? ? ?Discharge Instructions   ? ?  ?Your workup was overall reassuring in the ED today without acute findings. It is recommended that you take Miralax daily, incr

## 2021-08-05 ENCOUNTER — Ambulatory Visit (INDEPENDENT_AMBULATORY_CARE_PROVIDER_SITE_OTHER): Payer: BC Managed Care – PPO | Admitting: Family Medicine

## 2021-08-05 VITALS — BP 126/68 | HR 85 | Temp 97.4°F | Ht 67.0 in | Wt 262.4 lb

## 2021-08-05 DIAGNOSIS — J40 Bronchitis, not specified as acute or chronic: Secondary | ICD-10-CM | POA: Diagnosis not present

## 2021-08-05 MED ORDER — METHYLPREDNISOLONE 4 MG PO TBPK: ORAL_TABLET | ORAL | 0 refills | Status: DC

## 2021-08-05 NOTE — Progress Notes (Signed)
?[facility] ?[facility] ?[address] ?[place] [address] ?Dept: [phone] ?Dept Fax: [phone] ? ?Office Visit ? ?Subjective:  ? ? Patient ID: Patricia Adams, female    DOB: 09-01-1960, 61 y.o..   MRN: [id number] ? ?Chief Complaint  ?Patient presents with  ? Acute Visit  ?  C/o having cough, SOB, head congestion, ST, runny nose x 3 days.  She was seen at on-site work clinic (negative for Strep/Covid/Flu).   She has been using Flonase,   ? ? ?History of Present Illness: ? ?Patient is in today with a 4-day complaint of sinus drainage/rhinorrhea, sore throat, cough, and chest congestion. She was seen at a work clinic on Wed. She had a neg. COVID, influenza, and strep test. Although she denies any allergy history and has had no sneezing with this episode, she was prescribed a nasal steroid spray and Singulair. She does note that her son was sick recently with a virus and she feels she must have gotten this. Patricia Adams was a long time smoker, she quit in 2014. She has found in past viral illness that she improves with prednisone. ? ?Past Medical History: ?Patient Active Problem List  ? Diagnosis Date Noted  ? Basal cell carcinoma of face 06/09/2021  ? History of malignant neoplasm of skin 06/09/2021  ? Rosacea 06/09/2021  ? Hypothyroid 09/09/2020  ? Family history of early CAD 06/04/2017  ? Vitamin D deficiency 11/30/2016  ? Physical exam 05/05/2015  ? Morbid obesity ([place]) 05/05/2015  ? Arthritis of knee, degenerative 05/05/2015  ? Pain and swelling of right lower leg 04/20/2015  ? Trapezius muscle spasm 04/20/2015  ? Right elbow pain 04/20/2015  ? ?No past surgical history on file. ? ?Family History  ?Problem Relation Age of Onset  ? Alcohol abuse Mother   ? Heart disease Mother   ? Stroke Mother   ? Varicose Veins Mother   ? Early death Mother   ? Heart disease Father   ? COPD Father   ? Diabetes Paternal Aunt   ? Aneurysm Maternal Grandmother   ?     Brain  ?  Diabetes Paternal Grandfather   ? ?Outpatient Medications Prior to Visit  ?Medication Sig Dispense Refill  ? Calcium Carbonate-Vitamin D (CALCIUM-D PO) Take 1 tablet by mouth daily.    ? fluticasone (FLONASE) 50 MCG/ACT nasal spray Place into the nose.    ? levothyroxine (SYNTHROID) 50 MCG tablet TAKE 1 TABLET(50 MCG) BY MOUTH DAILY 90 tablet 1  ? montelukast (SINGULAIR) 10 MG tablet Take by mouth.    ? Vitamin D, Ergocalciferol, (DRISDOL) 1.25 MG (50000 UNIT) CAPS capsule Take 1 capsule (50,000 Units total) by mouth every 7 (seven) days. 12 capsule 0  ? ?No facility-administered medications prior to visit.  ? ?No Known Allergies ?   ?Objective:  ? ?Today's Vitals  ? 08/05/21 1323  ?BP: 126/68  ?Pulse: 85  ?Temp: (!) 97.4 ?F (36.3 ?C)  ?TempSrc: Temporal  ?SpO2: 96%  ?Weight: 262 lb 6.4 oz (119 kg)  ?Height: '5\' 7"'$  (1.702 m)  ? ?Body mass index is 41.1 kg/m?.  ? ?General: Well developed, well nourished. No acute distress. ?HEENT: Normocephalic, non-traumatic. Conjunctiva clear. External ears normal. EAC and TMs  ? normal bilaterally. Nose with moderate congestion and rhinorrhea. No pain on percussion over the  ? sinuses. Mucous membranes moist. Oropharynx clear. Good dentition. ?Neck: Supple. No lymphadenopathy. No thyromegaly. ?Lungs: Clear to auscultation bilaterally. No wheezing, rales or rhonchi. ?CV:  RRR without murmurs or rubs. Pulses 2+ bilaterally. ?Psych: Alert and oriented. Normal mood and affect. ? ?Health Maintenance Due  ?Topic Date Due  ? Zoster Vaccines- Shingrix (2 of 2) 08/04/2021  ?   ?Assessment & Plan:  ? ?1. Bronchitis ?Discussed home care for viral illness, including rest, pushing fluids, and OTC medications as needed for symptom relief. I will add a course of steroids to see if this helps resolve the chest symptoms. I advised her to consider stopping the Flonase and Singulair, as she has no clear sing of an allergy issue.  Follow-up if needed for worsening or persistent symptoms. ? ?-  methylPREDNISolone (MEDROL DOSEPAK) 4 MG TBPK tablet; Take as directed per package instructions.  Dispense: 21 tablet; Refill: 0 ? ?Return if symptoms worsen or fail to improve.  ? ?Haydee Salter, MD ?

## 2021-10-26 ENCOUNTER — Ambulatory Visit: Payer: BC Managed Care – PPO | Admitting: Nurse Practitioner

## 2021-10-26 ENCOUNTER — Encounter: Payer: Self-pay | Admitting: Nurse Practitioner

## 2021-10-26 ENCOUNTER — Telehealth: Payer: Self-pay | Admitting: Family Medicine

## 2021-10-26 VITALS — BP 128/70 | HR 78 | Temp 97.9°F | Wt 261.0 lb

## 2021-10-26 DIAGNOSIS — R399 Unspecified symptoms and signs involving the genitourinary system: Secondary | ICD-10-CM

## 2021-10-26 DIAGNOSIS — L719 Rosacea, unspecified: Secondary | ICD-10-CM | POA: Diagnosis not present

## 2021-10-26 DIAGNOSIS — R011 Cardiac murmur, unspecified: Secondary | ICD-10-CM

## 2021-10-26 DIAGNOSIS — N3001 Acute cystitis with hematuria: Secondary | ICD-10-CM | POA: Diagnosis not present

## 2021-10-26 LAB — POCT URINALYSIS DIPSTICK
Bilirubin, UA: NEGATIVE
Blood, UA: POSITIVE
Glucose, UA: NEGATIVE
Ketones, UA: NEGATIVE
Nitrite, UA: NEGATIVE
Protein, UA: NEGATIVE
Spec Grav, UA: 1.01 (ref 1.010–1.025)
Urobilinogen, UA: 0.2 E.U./dL
pH, UA: 6 (ref 5.0–8.0)

## 2021-10-26 MED ORDER — NITROFURANTOIN MONOHYD MACRO 100 MG PO CAPS: 100.0000 mg | ORAL_CAPSULE | Freq: Two times a day (BID) | ORAL | 0 refills | Status: DC

## 2021-10-26 MED ORDER — METRONIDAZOLE 0.75 % EX GEL: 1.0000 | Freq: Two times a day (BID) | CUTANEOUS | 0 refills | Status: DC

## 2021-10-26 NOTE — Progress Notes (Signed)
Acute Office Visit  Subjective:     Patient ID: Patricia Adams, female    DOB: Jul 28, 1960, 61 y.o.   MRN: [id number]  Chief Complaint  Patient presents with   Urinary Tract Infection    Pt c/o burning and discomfort when urinating w/ bladder pressure and blood in urine x1 day    Patient is in today for dysuria and bladder pressure for the past 3-4 days. She has also noticed redness on her nose.  It does not hurt or itch, but the provider at work that it might be rosacea.  URINARY SYMPTOMS  Dysuria: burning Urinary frequency: yes Urgency: yes Small volume voids: yes Symptom severity:  moderate Urinary incontinence: no Foul odor: no Hematuria: yes Abdominal pain: no Back pain: no Suprapubic pain/pressure: yes Flank pain: no Fever:  no Vomiting: no Relief with cranberry juice:  n/a Relief with pyridium:  n/a Status: stable Previous urinary tract infection: yes Recurrent urinary tract infection: no Treatments attempted: increasing fluids   ROS See pertinent positives and negatives per HPI.     Objective:    BP 128/70   Pulse 78   Temp 97.9 F (36.6 C) (Temporal)   Wt 261 lb (118.4 kg)   LMP 10/20/2011   SpO2 97%   BMI 40.88 kg/m    Physical Exam Vitals and nursing note reviewed.  Constitutional:      General: She is not in acute distress.    Appearance: Normal appearance.  HENT:     Head: Normocephalic.  Eyes:     Conjunctiva/sclera: Conjunctivae normal.  Cardiovascular:     Rate and Rhythm: Normal rate and regular rhythm.     Pulses: Normal pulses.     Heart sounds: Murmur heard.  Pulmonary:     Effort: Pulmonary effort is normal.     Breath sounds: Normal breath sounds.  Musculoskeletal:     Cervical back: Normal range of motion.  Skin:    General: Skin is warm.     Comments: Redness with spider veins to nose  Neurological:     General: No focal deficit present.     Mental Status: She is alert and oriented to person, place, and time.   Psychiatric:        Mood and Affect: Mood normal.        Behavior: Behavior normal.        Thought Content: Thought content normal.        Judgment: Judgment normal.     Results for orders placed or performed in visit on 10/26/21  POCT Urinalysis Dipstick  Result Value Ref Range   Color, UA Yellow    Clarity, UA     Glucose, UA Negative Negative   Bilirubin, UA neg    Ketones, UA neg    Spec Grav, UA 1.010 1.010 - 1.025   Blood, UA pos    pH, UA 6.0 5.0 - 8.0   Protein, UA Negative Negative   Urobilinogen, UA 0.2 0.2 or 1.0 E.U./dL   Nitrite, UA neg    Leukocytes, UA Large (3+) (A) Negative   Appearance     Odor          Assessment & Plan:   Problem List Items Addressed This Visit       Musculoskeletal and Integument   Rosacea    Redness to her nose, most consistent with rosacea.  We will have her start metronidazole gel daily to twice a day.  Discussed wearing sunscreen when outside.  Follow-up with her PCP at neck scheduled visit.      Other Visit Diagnoses     Acute cystitis with hematuria    -  Primary   We will treat with Macrobid twice a day for 5 days.  Encourage fluids.  Urine sent for culture.   Relevant Orders   Urine Culture   UTI symptoms       UA positive for blood and 3+ leukocytes, will treat for UTI.   Relevant Orders   POCT Urinalysis Dipstick (Completed)   Heart murmur       Slight murmur heard on exam today, no chest pain, shortness of breath.  Encouraged her to discuss this with her PCP if still present next visit       Meds ordered this encounter  Medications   nitrofurantoin, macrocrystal-monohydrate, (MACROBID) 100 MG capsule    Sig: Take 1 capsule (100 mg total) by mouth 2 (two) times daily.    Dispense:  10 capsule    Refill:  0   metroNIDAZOLE (METROGEL) 0.75 % gel    Sig: Apply 1 Application topically 2 (two) times daily.    Dispense:  45 g    Refill:  0    Return if symptoms worsen or fail to improve.  Charyl Dancer, NP

## 2021-10-26 NOTE — Patient Instructions (Signed)
It was great to see you!  Start macrobid twice a day with food for 5 days. Keep drinking plenty of fluids.   You can start using metronidazole gel on your nose at bedtime after washing your face. If you go outside, wear sunscreen.   Let's follow-up if your symptoms worsen or with any concerns.   Take care,  Vance Peper, NP

## 2021-10-26 NOTE — Telephone Encounter (Signed)
Pt called; states that she might have UTI and would like something called in for her [facility] on [address].

## 2021-10-26 NOTE — Progress Notes (Signed)
ur

## 2021-10-26 NOTE — Assessment & Plan Note (Signed)
Redness to her nose, most consistent with rosacea.  We will have her start metronidazole gel daily to twice a day.  Discussed wearing sunscreen when outside.  Follow-up with her PCP at neck scheduled visit.

## 2021-10-28 LAB — URINE CULTURE
MICRO NUMBER:: 13606717
SPECIMEN QUALITY:: ADEQUATE

## 2021-10-31 ENCOUNTER — Telehealth: Payer: Self-pay | Admitting: Family Medicine

## 2021-10-31 MED ORDER — CIPROFLOXACIN HCL 250 MG PO TABS: 250.0000 mg | ORAL_TABLET | Freq: Two times a day (BID) | ORAL | 0 refills | Status: AC

## 2021-10-31 NOTE — Telephone Encounter (Signed)
Spoke to patient and gave provider instructions. Pt voiced understanding. Sw, cma

## 2021-10-31 NOTE — Telephone Encounter (Signed)
Called and spoke to pt, she stated she is still having ongoing symptoms after taking abx.

## 2021-10-31 NOTE — Telephone Encounter (Signed)
Pt was here 7/5 for a UTI she finished her med  Nitrofurantoin. She is still having symptoms and wonders if her culture has come back. She also would like another med called in.

## 2021-11-18 ENCOUNTER — Other Ambulatory Visit: Payer: Self-pay

## 2021-11-18 MED ORDER — LEVOTHYROXINE SODIUM 50 MCG PO TABS: 50.0000 ug | ORAL_TABLET | Freq: Every day | ORAL | 1 refills | Status: DC

## 2022-01-26 ENCOUNTER — Encounter: Payer: Self-pay | Admitting: Family Medicine

## 2022-01-26 ENCOUNTER — Telehealth (INDEPENDENT_AMBULATORY_CARE_PROVIDER_SITE_OTHER): Payer: BC Managed Care – PPO | Admitting: Family Medicine

## 2022-01-26 DIAGNOSIS — R051 Acute cough: Secondary | ICD-10-CM

## 2022-01-26 MED ORDER — PREDNISONE 10 MG PO TABS: ORAL_TABLET | ORAL | 0 refills | Status: DC

## 2022-01-26 NOTE — Progress Notes (Signed)
Virtual Visit via Video   I connected with patient on 01/26/22 at  9:40 AM EDT by a video enabled telemedicine application and verified that I am speaking with the correct person using two identifiers.  Location patient: Home Location provider: [facility], Office Persons participating in the virtual visit: Patient, Provider, [place] Marcille Blanco C)  I discussed the limitations of evaluation and management by telemedicine and the availability of in person appointments. The patient expressed understanding and agreed to proceed.  Subjective:   HPI:   Cough- daughter and grandson visited 9/23 while both were sick.  9/27 she developed 'cold like sxs'.  Pt reports sxs are now in her chest.  No fever.  No body aches or chills.  Denies sinus pain/pressure.  No HA.  Cough is wet but not productive.  Denies SOB.  Pt is former smoker.  Pt is on at least day 8 of illness and sxs are not improving  ROS:   See pertinent positives and negatives per HPI.  Patient Active Problem List   Diagnosis Date Noted   Basal cell carcinoma of face 06/09/2021   History of malignant neoplasm of skin 06/09/2021   Rosacea 06/09/2021   Hypothyroid 09/09/2020   Family history of early CAD 06/04/2017   Vitamin D deficiency 11/30/2016   Physical exam 05/05/2015   Morbid obesity ([place]) 05/05/2015   Arthritis of knee, degenerative 05/05/2015   Pain and swelling of right lower leg 04/20/2015   Trapezius muscle spasm 04/20/2015   Right elbow pain 04/20/2015    Social History   Tobacco Use   Smoking status: Former   Smokeless tobacco: Never  Substance Use Topics   Alcohol use: No    Current Outpatient Medications:    BIOTIN PO, Take by mouth., Disp: , Rfl:    Calcium Carbonate-Vitamin D (CALCIUM-D PO), Take 1 tablet by mouth daily., Disp: , Rfl:    levothyroxine (SYNTHROID) 50 MCG tablet, Take 1 tablet (50 mcg total) by mouth daily before breakfast., Disp: 90 tablet, Rfl: 1   Multiple Vitamin  (MULTIVITAMIN ADULT PO), Take by mouth., Disp: , Rfl:    Omega-3 Fatty Acids (FISH OIL OMEGA-3 PO), Take by mouth., Disp: , Rfl:    Vitamin D, Ergocalciferol, (DRISDOL) 1.25 MG (50000 UNIT) CAPS capsule, Take 1 capsule (50,000 Units total) by mouth every 7 (seven) days., Disp: 12 capsule, Rfl: 0   metroNIDAZOLE (METROGEL) 0.75 % gel, Apply 1 Application topically 2 (two) times daily. (Patient not taking: Reported on 01/26/2022), Disp: 45 g, Rfl: 0   nitrofurantoin, macrocrystal-monohydrate, (MACROBID) 100 MG capsule, Take 1 capsule (100 mg total) by mouth 2 (two) times daily. (Patient not taking: Reported on 01/26/2022), Disp: 10 capsule, Rfl: 0  No Known Allergies  Objective:   LMP 10/20/2011  AAOx3, NAD NCAT, EOMI No obvious CN deficits Coloring WNL Pt is able to speak clearly, coherently without shortness of breath or increased work of breathing.  Deep hacking cough Thought process is linear.  Mood is appropriate.   Assessment and Plan:   Cough- new.  Pt w/ known sick contacts but states they were negative for COVID.  Denies body aches, chills, fever, HA, sinus congestion.  Only concern is cough- which seems to have settled in her chest.  She is not interested in abx or albuterol inhaler.  Reports that when this happens, she benefits from taking prednisone and this is what she would like.  Start prednisone as directed.  Reviewed supportive care and red flags that should  prompt return.  Pt expressed understanding and is in agreement w/ plan.    Annye Asa, MD 01/26/2022

## 2022-03-03 ENCOUNTER — Telehealth: Payer: BC Managed Care – PPO | Admitting: Family Medicine

## 2022-03-03 ENCOUNTER — Encounter: Payer: Self-pay | Admitting: Family Medicine

## 2022-03-03 DIAGNOSIS — U071 COVID-19: Secondary | ICD-10-CM | POA: Diagnosis not present

## 2022-03-03 MED ORDER — MOLNUPIRAVIR EUA 200MG CAPSULE: 4.0000 | ORAL_CAPSULE | Freq: Two times a day (BID) | ORAL | 0 refills | Status: AC

## 2022-03-03 NOTE — Progress Notes (Signed)
   Virtual Visit via Video   I connected with patient on 03/03/22 at 10:00 AM EST by a video enabled telemedicine application and verified that I am speaking with the correct person using two identifiers.  Location patient: Home Location provider: [facility], Office Persons participating in the virtual visit: Patient, Provider, [place] Marcille Blanco C)  I discussed the limitations of evaluation and management by telemedicine and the availability of in person appointments. The patient expressed understanding and agreed to proceed.  Subjective:   HPI:   COVID- sxs started Tuesday w/ sore throat.  Acutely worsened yesterday and now has congestion, HA's, body aches, dizziness, sore throat.  Denies fever.  Tested + yesterday afternoon.  + fatigue.    ROS:   See pertinent positives and negatives per HPI.  Patient Active Problem List   Diagnosis Date Noted   Basal cell carcinoma of face 06/09/2021   History of malignant neoplasm of skin 06/09/2021   Rosacea 06/09/2021   Hypothyroid 09/09/2020   Family history of early CAD 06/04/2017   Vitamin D deficiency 11/30/2016   Physical exam 05/05/2015   Morbid obesity ([place]) 05/05/2015   Arthritis of knee, degenerative 05/05/2015   Pain and swelling of right lower leg 04/20/2015   Trapezius muscle spasm 04/20/2015   Right elbow pain 04/20/2015    Social History   Tobacco Use   Smoking status: Former   Smokeless tobacco: Never  Substance Use Topics   Alcohol use: No    Current Outpatient Medications:    BIOTIN PO, Take by mouth., Disp: , Rfl:    Calcium Carbonate-Vitamin D (CALCIUM-D PO), Take 1 tablet by mouth daily., Disp: , Rfl:    levothyroxine (SYNTHROID) 50 MCG tablet, Take 1 tablet (50 mcg total) by mouth daily before breakfast., Disp: 90 tablet, Rfl: 1   Multiple Vitamin (MULTIVITAMIN ADULT PO), Take by mouth., Disp: , Rfl:    Omega-3 Fatty Acids (FISH OIL OMEGA-3 PO), Take by mouth., Disp: , Rfl:    Vitamin D,  Ergocalciferol, (DRISDOL) 1.25 MG (50000 UNIT) CAPS capsule, Take 1 capsule (50,000 Units total) by mouth every 7 (seven) days., Disp: 12 capsule, Rfl: 0   predniSONE (DELTASONE) 10 MG tablet, 3 tabs x3 days and then 2 tabs x3 days and then 1 tab x3 days.  Take w/ food. (Patient not taking: Reported on 03/03/2022), Disp: 18 tablet, Rfl: 0  No Known Allergies  Objective:   LMP 10/20/2011  AAOx3, NAD NCAT, EOMI No obvious CN deficits Coloring WNL Pt is able to speak clearly, coherently without shortness of breath or increased work of breathing.  Thought process is linear.  Mood is appropriate.   Assessment and Plan:   COVID- pt's sxs and home test confirm dx.  Since she is within 5 days of sxs, will start [place].  Reviewed supportive care and red flags that should prompt return.  Pt expressed understanding and is in agreement w/ plan.    Annye Asa, MD 03/03/2022

## 2022-03-07 ENCOUNTER — Telehealth: Payer: Self-pay

## 2022-03-07 NOTE — Telephone Encounter (Signed)
Ok to skip last dose in case of medication reaction

## 2022-03-07 NOTE — Telephone Encounter (Signed)
Spoke w/  pt and advised Dr Birdie Riddle states it is ok to skip last dose in case of allergic reaction and advised to take some Benadryl

## 2022-03-07 NOTE — Telephone Encounter (Signed)
Patient notes she is on the last day of the Paxlovid, notes a rash started on her torso, small bumps, pinkish and itching, wasn't sure if she should take the last dose of the antiviral or stop   Please advise

## 2022-03-31 ENCOUNTER — Ambulatory Visit (INDEPENDENT_AMBULATORY_CARE_PROVIDER_SITE_OTHER): Payer: BC Managed Care – PPO | Admitting: Family Medicine

## 2022-03-31 ENCOUNTER — Encounter: Payer: Self-pay | Admitting: Family Medicine

## 2022-03-31 VITALS — BP 116/84 | HR 82 | Temp 98.9°F | Resp 19 | Ht 67.0 in | Wt 273.2 lb

## 2022-03-31 DIAGNOSIS — R059 Cough, unspecified: Secondary | ICD-10-CM

## 2022-03-31 LAB — POC COVID19 BINAXNOW: SARS Coronavirus 2 Ag: NEGATIVE

## 2022-03-31 LAB — POCT RESPIRATORY SYNCYTIAL VIRUS: RSV Rapid Ag: NEGATIVE

## 2022-03-31 MED ORDER — QVAR REDIHALER 80 MCG/ACT IN AERB: 1.0000 | INHALATION_SPRAY | Freq: Two times a day (BID) | RESPIRATORY_TRACT | 1 refills | Status: DC

## 2022-03-31 MED ORDER — ALBUTEROL SULFATE HFA 108 (90 BASE) MCG/ACT IN AERS: 2.0000 | INHALATION_SPRAY | Freq: Four times a day (QID) | RESPIRATORY_TRACT | 0 refills | Status: DC | PRN

## 2022-03-31 MED ORDER — GUAIFENESIN-CODEINE 100-10 MG/5ML PO SYRP: 10.0000 mL | ORAL_SOLUTION | Freq: Three times a day (TID) | ORAL | 0 refills | Status: DC | PRN

## 2022-03-31 MED ORDER — AZITHROMYCIN 250 MG PO TABS: ORAL_TABLET | ORAL | 0 refills | Status: DC

## 2022-03-31 MED ORDER — FLUTICASONE PROPIONATE HFA 110 MCG/ACT IN AERO: 2.0000 | INHALATION_SPRAY | Freq: Two times a day (BID) | RESPIRATORY_TRACT | 12 refills | Status: DC

## 2022-03-31 NOTE — Progress Notes (Unsigned)
   Subjective:    Patient ID: Patricia Adams, female    DOB: 1960/09/25, 61 y.o.   MRN: [id number]  HPI Cough- pt had COVID 4 weeks ago.  Was improving until Sunday night when she again had cough and chest congestion.  Sxs have worsened as the week went on.  Denies fever.  + HA, nasal congestion, deep hacking cough- cough has become somewhat productive today.  + SOB after coughing.  + sick contacts.     Review of Systems For ROS see HPI     Objective:   Physical Exam Constitutional:      General: She is not in acute distress.    Appearance: She is well-developed.  HENT:     Head: Normocephalic and atraumatic.  Eyes:     Conjunctiva/sclera: Conjunctivae normal.     Pupils: Pupils are equal, round, and reactive to light.  Cardiovascular:     Rate and Rhythm: Normal rate and regular rhythm.     Heart sounds: Normal heart sounds. No murmur heard. Pulmonary:     Effort: Pulmonary effort is normal. No respiratory distress.     Breath sounds: Normal breath sounds. No wheezing.     Comments: Deep hacking cough.  Almost continuous when talking Musculoskeletal:     Cervical back: Normal range of motion and neck supple.  Lymphadenopathy:     Cervical: No cervical adenopathy.           Assessment & Plan:  Cough- new.  Pt had COVID 3 weeks ago and was improving until Sunday night when cough worsened.  No fevers, chills, body aches, sinus pressure, ear pain.  + congestion.  Will start Zpack as pt has close contact w/ bronchitis.  Add albuterol as needed and ICS until feeling better.  Will hold off on oral prednisone at this time.  Cough meds prn.  Reviewed supportive care and red flags that should prompt return.  Pt expressed understanding and is in agreement w/ plan.

## 2022-03-31 NOTE — Patient Instructions (Signed)
Follow up as needed or as scheduled START the Zpack- 2 today and then 1 daily USE the Fluticasone (Flovent) 2 puffs twice daily until feeling better ADD the Albuterol as needed for shortness of breath, cough jags, or wheezing TAKE the cough syrup as needed- may cause drowsiness Lots of fluids Lots of rest! [place] in there! Happy Holidays!!!

## 2022-04-03 ENCOUNTER — Telehealth: Payer: Self-pay

## 2022-04-03 NOTE — Telephone Encounter (Signed)
Ok to simply stop once she is feeling better

## 2022-04-03 NOTE — Telephone Encounter (Signed)
Pt has been taking Qvar, notes instructions stated do not stop without notifying your provider, she would like to know when she is done with this /doesn't need it the same way can she simply stop this or does she need to ween off from it?

## 2022-04-03 NOTE — Telephone Encounter (Signed)
Pt question

## 2022-04-03 NOTE — Telephone Encounter (Signed)
Pt went to refill her Fluticasone inhaler and insurance denied it . They are requesting we send in Asmanex Qvar ,or Arnuity . Can we send in one of these for the pt ?

## 2022-04-03 NOTE — Telephone Encounter (Signed)
Informed pt that she can stop the inhaler once she starts to feel better per Dr Birdie Riddle

## 2022-04-03 NOTE — Telephone Encounter (Signed)
A qvar inhaler was sent on Friday for pt

## 2022-06-12 ENCOUNTER — Ambulatory Visit (INDEPENDENT_AMBULATORY_CARE_PROVIDER_SITE_OTHER): Payer: BC Managed Care – PPO | Admitting: Family Medicine

## 2022-06-12 ENCOUNTER — Encounter: Payer: Self-pay | Admitting: Family Medicine

## 2022-06-12 ENCOUNTER — Telehealth: Payer: Self-pay

## 2022-06-12 VITALS — BP 124/76 | HR 69 | Temp 98.4°F | Resp 17 | Ht 67.0 in | Wt 276.4 lb

## 2022-06-12 DIAGNOSIS — Z23 Encounter for immunization: Secondary | ICD-10-CM

## 2022-06-12 DIAGNOSIS — R011 Cardiac murmur, unspecified: Secondary | ICD-10-CM

## 2022-06-12 DIAGNOSIS — E559 Vitamin D deficiency, unspecified: Secondary | ICD-10-CM | POA: Diagnosis not present

## 2022-06-12 DIAGNOSIS — Z0000 Encounter for general adult medical examination without abnormal findings: Secondary | ICD-10-CM | POA: Diagnosis not present

## 2022-06-12 HISTORY — DX: Cardiac murmur, unspecified: R01.1

## 2022-06-12 LAB — BASIC METABOLIC PANEL
BUN: 16 mg/dL (ref 6–23)
CO2: 26 mEq/L (ref 19–32)
Calcium: 9.7 mg/dL (ref 8.4–10.5)
Chloride: 106 mEq/L (ref 96–112)
Creatinine, Ser: 0.93 mg/dL (ref 0.40–1.20)
GFR: 66.82 mL/min (ref >60.00)
Glucose, Bld: 105 mg/dL — ABNORMAL HIGH (ref 70–99)
Potassium: 4.8 mEq/L (ref 3.5–5.1)
Sodium: 140 mEq/L (ref 135–145)

## 2022-06-12 LAB — LIPID PANEL
Cholesterol: 198 mg/dL (ref 0–200)
HDL: 66.6 mg/dL (ref >39.00)
LDL Cholesterol: 101 mg/dL — ABNORMAL HIGH (ref 0–99)
NonHDL: 131.61
Total CHOL/HDL Ratio: 3
Triglycerides: 153 mg/dL — ABNORMAL HIGH (ref 0.0–149.0)
VLDL: 30.6 mg/dL (ref 0.0–40.0)

## 2022-06-12 LAB — CBC WITH DIFFERENTIAL/PLATELET
Basophils Absolute: 0 10*3/uL (ref 0.0–0.1)
Basophils Relative: 0.5 % (ref 0.0–3.0)
Eosinophils Absolute: 0.2 10*3/uL (ref 0.0–0.7)
Eosinophils Relative: 3.1 % (ref 0.0–5.0)
HCT: 35.7 % — ABNORMAL LOW (ref 36.0–46.0)
Hemoglobin: 11.9 g/dL — ABNORMAL LOW (ref 12.0–15.0)
Lymphocytes Relative: 27.1 % (ref 12.0–46.0)
Lymphs Abs: 1.3 10*3/uL (ref 0.7–4.0)
MCHC: 33.3 g/dL (ref 30.0–36.0)
MCV: 85.8 fl (ref 78.0–100.0)
Monocytes Absolute: 0.5 10*3/uL (ref 0.1–1.0)
Monocytes Relative: 11.2 % (ref 3.0–12.0)
Neutro Abs: 2.8 10*3/uL (ref 1.4–7.7)
Neutrophils Relative %: 58.1 % (ref 43.0–77.0)
Platelets: 298 10*3/uL (ref 150.0–400.0)
RBC: 4.16 Mil/uL (ref 3.87–5.11)
RDW: 14.5 % (ref 11.5–15.5)
WBC: 4.9 10*3/uL (ref 4.0–10.5)

## 2022-06-12 LAB — HEPATIC FUNCTION PANEL
ALT: 16 U/L (ref 0–35)
AST: 17 U/L (ref 0–37)
Albumin: 4.1 g/dL (ref 3.5–5.2)
Alkaline Phosphatase: 94 U/L (ref 39–117)
Bilirubin, Direct: 0.1 mg/dL (ref 0.0–0.3)
Total Bilirubin: 0.4 mg/dL (ref 0.2–1.2)
Total Protein: 7.1 g/dL (ref 6.0–8.3)

## 2022-06-12 LAB — VITAMIN D 25 HYDROXY (VIT D DEFICIENCY, FRACTURES): VITD: 30.09 ng/mL (ref 30.00–100.00)

## 2022-06-12 LAB — HEMOGLOBIN A1C: Hgb A1c MFr Bld: 5.6 % (ref 4.6–6.5)

## 2022-06-12 LAB — TSH: TSH: 2.28 u[IU]/mL (ref 0.35–5.50)

## 2022-06-12 NOTE — Patient Instructions (Signed)
Follow up in 3 months to recheck weight loss progress We'll notify you of your lab results and make any changes if needed Call your insurance company and ask if you have a weight loss benefit and if so, what meds are covered Continue to work on low carb diet and regular exercise- you can do it!! We'll call you to schedule your ECHO (ultrasound of heart) to evaluate your murmur Call with any questions or concerns Stay Safe!  Stay Healthy!

## 2022-06-12 NOTE — Assessment & Plan Note (Signed)
New.  Will get ECHO to assess as this is first time this has been heard.  Pt expressed understanding and is in agreement w/ plan.

## 2022-06-12 NOTE — Assessment & Plan Note (Signed)
Check labs and replete prn. 

## 2022-06-12 NOTE — Telephone Encounter (Signed)
Informed pt of lab results  

## 2022-06-12 NOTE — Assessment & Plan Note (Signed)
Ongoing issue for pt.  BMI 43.29.  Encouraged her to call insurance and see if she has a weight loss benefit.  Check labs to risk stratify.  Will follow.

## 2022-06-12 NOTE — Telephone Encounter (Signed)
-----   Message from Midge Minium, MD sent at 06/12/2022  3:33 PM EST ----- Labs look great!  No changes at this time

## 2022-06-12 NOTE — Progress Notes (Signed)
   Subjective:    Patient ID: Patricia Adams, female    DOB: 12-Nov-1960, 62 y.o.   MRN: [id number]  HPI CPE- UTD on pap, mammo, colonoscopy, Tdap.  Declines flu  Patient Care Team    Relationship Specialty Notifications Start End  Midge Minium, MD PCP - General Family Medicine  04/20/15   Bobbye Charleston, MD Consulting Physician Obstetrics and Gynecology  04/20/15   Juanita Craver, MD Consulting Physician Gastroenterology  04/20/15     Health Maintenance  Topic Date Due   Zoster Vaccines- Shingrix (2 of 2) 08/04/2021   INFLUENZA VACCINE  07/23/2022 (Originally 11/22/2021)   PAP SMEAR-Modifier  03/21/2023   MAMMOGRAM  06/08/2023   COLONOSCOPY (Pts 45-74yr Insurance coverage will need to be confirmed)  01/22/2025   DTaP/Tdap/Td (2 - Td or Tdap) 04/05/2025   Hepatitis C Screening  Completed   HIV Screening  Completed   HPV VACCINES  Aged Out   COVID-19 Vaccine  Discontinued      Review of Systems Patient reports no vision/ hearing changes, adenopathy,fever, weight change,  persistant/recurrent hoarseness , swallowing issues, chest pain, palpitations, edema, persistant/recurrent cough, hemoptysis, dyspnea (rest/exertional/paroxysmal nocturnal), gastrointestinal bleeding (melena, rectal bleeding), abdominal pain, significant heartburn, bowel changes, GU symptoms (dysuria, hematuria, incontinence), Gyn symptoms (abnormal  bleeding, pain),  syncope, focal weakness, memory loss, numbness & tingling, skin/hair/nail changes, abnormal bruising or bleeding, anxiety, or depression.     Objective:   Physical Exam General Appearance:    Alert, cooperative, no distress, appears stated age, obese  Head:    Normocephalic, without obvious abnormality, atraumatic  Eyes:    PERRL, conjunctiva/corneas clear, EOM's intact both eyes  Ears:    Normal TM's and external ear canals, both ears  Nose:   Nares normal, septum midline, mucosa normal, no drainage    or sinus tenderness  Throat:   Lips,  mucosa, and tongue normal; teeth and gums normal  Neck:   Supple, symmetrical, trachea midline, no adenopathy;    Thyroid: no enlargement/tenderness/nodules  Back:     Symmetric, no curvature, ROM normal, no CVA tenderness  Lungs:     Clear to auscultation bilaterally, respirations unlabored  Chest Wall:    No tenderness or deformity   Heart:    Regular rate and rhythm, S1 and S2 normal, II/VI SEM murmur, no rub or gallop  Breast Exam:    Deferred to GYN  Abdomen:     Soft, non-tender, bowel sounds active all four quadrants,    no masses, no organomegaly  Genitalia:    Deferred to GYN  Rectal:    Extremities:   Extremities normal, atraumatic, no cyanosis or edema  Pulses:   2+ and symmetric all extremities  Skin:   Skin color, texture, turgor normal, no rashes or lesions  Lymph nodes:   Cervical, supraclavicular, and axillary nodes normal  Neurologic:   CNII-XII intact, normal strength, sensation and reflexes    throughout          Assessment & Plan:

## 2022-06-12 NOTE — Assessment & Plan Note (Signed)
Pt's PE WNL w/ exception of BMI and SEM.  UTD on pap, mammo, colonoscopy, Tdap.  Check labs.  Anticipatory guidance provided.

## 2022-06-14 DIAGNOSIS — L821 Other seborrheic keratosis: Secondary | ICD-10-CM | POA: Diagnosis not present

## 2022-06-14 DIAGNOSIS — D1801 Hemangioma of skin and subcutaneous tissue: Secondary | ICD-10-CM | POA: Diagnosis not present

## 2022-06-14 DIAGNOSIS — L814 Other melanin hyperpigmentation: Secondary | ICD-10-CM | POA: Diagnosis not present

## 2022-06-14 DIAGNOSIS — L578 Other skin changes due to chronic exposure to nonionizing radiation: Secondary | ICD-10-CM | POA: Diagnosis not present

## 2022-06-15 ENCOUNTER — Other Ambulatory Visit: Payer: Self-pay

## 2022-06-15 DIAGNOSIS — E039 Hypothyroidism, unspecified: Secondary | ICD-10-CM

## 2022-06-15 MED ORDER — LEVOTHYROXINE SODIUM 50 MCG PO TABS: 50.0000 ug | ORAL_TABLET | Freq: Every day | ORAL | 1 refills | Status: DC

## 2022-06-28 ENCOUNTER — Encounter: Payer: Self-pay | Admitting: Family Medicine

## 2022-06-28 DIAGNOSIS — Z1231 Encounter for screening mammogram for malignant neoplasm of breast: Secondary | ICD-10-CM | POA: Diagnosis not present

## 2022-06-28 DIAGNOSIS — Z124 Encounter for screening for malignant neoplasm of cervix: Secondary | ICD-10-CM | POA: Diagnosis not present

## 2022-06-28 DIAGNOSIS — Z01419 Encounter for gynecological examination (general) (routine) without abnormal findings: Secondary | ICD-10-CM | POA: Diagnosis not present

## 2022-06-28 LAB — HM PAP SMEAR

## 2022-07-12 ENCOUNTER — Ambulatory Visit (HOSPITAL_BASED_OUTPATIENT_CLINIC_OR_DEPARTMENT_OTHER)
Admission: RE | Admit: 2022-07-12 | Discharge: 2022-07-12 | Disposition: A | Discharge status: Home / Self Care | Payer: BC Managed Care – PPO | Source: Ambulatory Visit | Attending: Family Medicine | Admitting: Family Medicine

## 2022-07-12 DIAGNOSIS — R011 Cardiac murmur, unspecified: Secondary | ICD-10-CM | POA: Insufficient documentation

## 2022-07-14 LAB — ECHOCARDIOGRAM COMPLETE
AR max vel: 2.19 cm2
AV Area VTI: 2.06 cm2
AV Area mean vel: 2 cm2
AV Mean grad: 8 mmHg
AV Peak grad: 14.7 mmHg
Ao pk vel: 1.92 m/s
Area-P 1/2: 2.76 cm2
S' Lateral: 2.7 cm

## 2022-09-20 ENCOUNTER — Ambulatory Visit: Payer: BC Managed Care – PPO | Admitting: Family Medicine

## 2022-10-04 ENCOUNTER — Ambulatory Visit: Payer: BC Managed Care – PPO | Admitting: Family Medicine

## 2022-10-16 ENCOUNTER — Other Ambulatory Visit: Payer: Self-pay

## 2022-10-16 NOTE — Addendum Note (Signed)
Addended by: Carvell Hoeffner on: 10/16/2022 12:11 PM   Modules accepted: Orders  

## 2022-11-04 ENCOUNTER — Other Ambulatory Visit: Payer: Self-pay | Admitting: Family Medicine

## 2022-11-04 DIAGNOSIS — E039 Hypothyroidism, unspecified: Secondary | ICD-10-CM

## 2023-04-12 ENCOUNTER — Telehealth: Payer: BC Managed Care – PPO | Admitting: Family Medicine

## 2023-04-12 ENCOUNTER — Encounter: Payer: Self-pay | Admitting: Family Medicine

## 2023-04-12 DIAGNOSIS — R051 Acute cough: Secondary | ICD-10-CM | POA: Diagnosis not present

## 2023-04-12 MED ORDER — PREDNISONE 10 MG PO TABS: ORAL_TABLET | ORAL | 0 refills | Status: DC

## 2023-04-12 MED ORDER — PROMETHAZINE-DM 6.25-15 MG/5ML PO SYRP: 5.0000 mL | ORAL_SOLUTION | Freq: Four times a day (QID) | ORAL | 0 refills | Status: DC | PRN

## 2023-04-12 MED ORDER — AZITHROMYCIN 250 MG PO TABS: ORAL_TABLET | ORAL | 0 refills | Status: DC

## 2023-04-12 NOTE — Progress Notes (Signed)
   Virtual Visit via Video   I connected with patient on 04/12/23 at  1:20 PM EST by a video enabled telemedicine application and verified that I am speaking with the correct person using two identifiers.  Location patient: Home Location provider: [place] [facility], Office Persons participating in the virtual visit: Patient, Provider, CMA Tresa Endo C)  I discussed the limitations of evaluation and management by telemedicine and the availability of in person appointments. The patient expressed understanding and agreed to proceed.  Subjective:   HPI:   URI- sxs started 1 week ago w/ sore throat.  No fevers.  Mild body aches, fatigue.  Intermittent headache.  + congestion.  Denies sinus pain/pressure.  + cough- not productive.  Denies SOB but + wheezing.  + sick contacts.  Pt has hx of smoking  ROS:   See pertinent positives and negatives per HPI.  Patient Active Problem List   Diagnosis Date Noted   Systolic murmur 06/12/2022   Basal cell carcinoma of face 06/09/2021   History of malignant neoplasm of skin 06/09/2021   Rosacea 06/09/2021   Hypothyroid 09/09/2020   Family history of early CAD 06/04/2017   Vitamin D deficiency 11/30/2016   Physical exam 05/05/2015   Morbid obesity ([facility]) 05/05/2015   Arthritis of knee, degenerative 05/05/2015   Pain and swelling of right lower leg 04/20/2015   Trapezius muscle spasm 04/20/2015   Right elbow pain 04/20/2015    Social History   Tobacco Use   Smoking status: Former   Smokeless tobacco: Never  Substance Use Topics   Alcohol use: No    Current Outpatient Medications:    BIOTIN PO, Take by mouth., Disp: , Rfl:    Calcium Carbonate-Vitamin D (CALCIUM-D PO), Take 1 tablet by mouth daily., Disp: , Rfl:    levothyroxine (SYNTHROID) 50 MCG tablet, TAKE 1 TABLET(50 MCG) BY MOUTH DAILY BEFORE BREAKFAST, Disp: 90 tablet, Rfl: 1   Multiple Vitamin (MULTIVITAMIN ADULT PO), Take by mouth., Disp: , Rfl:    Omega-3 Fatty Acids (FISH OIL  OMEGA-3 PO), Take by mouth., Disp: , Rfl:    Vitamin D, Ergocalciferol, (DRISDOL) 1.25 MG (50000 UNIT) CAPS capsule, Take 1 capsule (50,000 Units total) by mouth every 7 (seven) days., Disp: 12 capsule, Rfl: 0  No Known Allergies  Objective:   LMP 10/20/2011  AAOx3, NAD NCAT, EOMI No obvious CN deficits Coloring WNL Pt is able to speak clearly, coherently without shortness of breath or increased work of breathing. + dry cough Thought process is linear.  Mood is appropriate.   Assessment and Plan:   Acute cough- new.  Pt has hx of similar.  She has hx of smoking and has required prednisone in the past for wheezing and SOB.  Start Zpack, Prednisone, cough syrup.  Reviewed supportive care and red flags that should prompt return.  Pt expressed understanding and is in agreement w/ plan.    Neena Rhymes, MD 04/12/2023

## 2023-06-11 ENCOUNTER — Telehealth: Payer: Self-pay

## 2023-06-11 ENCOUNTER — Telehealth: Payer: Self-pay | Admitting: Family Medicine

## 2023-06-11 NOTE — Telephone Encounter (Signed)
Copied from CRM [id number]. Topic: Clinical - Medication Question >> Jun 11, 2023 11:33 AM Presley Raddle C wrote: Reason for CRM: pt called to reschedule her CPE to an appointment earlier in the day since she needs to fast for labs. She asked for another message to be sent back to see if her request for medication refills can still be accommodated to cover her until her appointment since its been pushed further out. Please call and advise.

## 2023-06-11 NOTE — Telephone Encounter (Signed)
Copied from CRM [id number]. Topic: Clinical - Prescription Issue >> Jun 11, 2023  9:58 AM Irine Seal wrote: Reason for CRM: levothyroxine (SYNTHROID) 50 MCG patient has 2-3 tablets left and is scheduled for an appt 07/19/23 she is needing a refill to hold her over until her appt time.  Pharmacy  [id number] - [place], [place] - [address] AT [address] [place] [address] Phone: [phone]  Fax: [phone]

## 2023-06-12 ENCOUNTER — Other Ambulatory Visit: Payer: Self-pay

## 2023-06-12 DIAGNOSIS — E039 Hypothyroidism, unspecified: Secondary | ICD-10-CM

## 2023-06-12 MED ORDER — LEVOTHYROXINE SODIUM 50 MCG PO TABS: 50.0000 ug | ORAL_TABLET | Freq: Every day | ORAL | 1 refills | Status: DC

## 2023-06-12 NOTE — Telephone Encounter (Signed)
Called pt and notes Levothyroxine was the requested med this has been sent in and patient has been advised

## 2023-06-20 DIAGNOSIS — L578 Other skin changes due to chronic exposure to nonionizing radiation: Secondary | ICD-10-CM | POA: Diagnosis not present

## 2023-06-20 DIAGNOSIS — L821 Other seborrheic keratosis: Secondary | ICD-10-CM | POA: Diagnosis not present

## 2023-06-20 DIAGNOSIS — D229 Melanocytic nevi, unspecified: Secondary | ICD-10-CM | POA: Diagnosis not present

## 2023-06-20 DIAGNOSIS — L814 Other melanin hyperpigmentation: Secondary | ICD-10-CM | POA: Diagnosis not present

## 2023-07-03 DIAGNOSIS — Z01419 Encounter for gynecological examination (general) (routine) without abnormal findings: Secondary | ICD-10-CM | POA: Diagnosis not present

## 2023-07-03 DIAGNOSIS — Z1231 Encounter for screening mammogram for malignant neoplasm of breast: Secondary | ICD-10-CM | POA: Diagnosis not present

## 2023-07-03 LAB — HM MAMMOGRAPHY

## 2023-07-09 DIAGNOSIS — D2239 Melanocytic nevi of other parts of face: Secondary | ICD-10-CM | POA: Diagnosis not present

## 2023-07-19 ENCOUNTER — Encounter: Payer: BC Managed Care – PPO | Admitting: Family Medicine

## 2023-07-24 ENCOUNTER — Ambulatory Visit: Payer: Self-pay

## 2023-07-24 NOTE — Telephone Encounter (Signed)
 Copied from CRM [id number]. Topic: Clinical - Medication Question >> Jul 24, 2023  5:01 PM [place] J wrote: Reason for CRM: Patient is wondering if she can receive antibiotics for UTI symptoms.   Chief Complaint: UTI Symptoms: urinary frequency, pressure, lower abdominal pain Frequency: this evening  Pertinent Negatives: Patient denies vaginal discharge or bleeding Disposition: [] ED /[] Urgent Care (no appt availability in office) / [] Appointment(In office/virtual)/ []  [facility]/ [] Home Care/ [] Refused Recommended Disposition /[] [facility]/ [x]  Follow-up with PCP Additional Notes: pt asking if PCP could send in abx for possible UTI since sx just started today and pt doesn't want to come in prior to CPE Thursday. Advised I would send message back and staff will contact her back when office is open. Care advice given and recommended pt can try AZO OTC if needed to help with sx as well. Pt verbalized understanding.   Reason for Disposition  Urinating more frequently than usual (i.e., frequency)  Answer Assessment - Initial Assessment Questions 1. SYMPTOM: "What's the main symptom you're concerned about?" (e.g., frequency, incontinence)     Frequency  2. ONSET: "When did the  sx  start?"     This afternoon  3. PAIN: "Is there any pain?" If Yes, ask: "How bad is it?" (Scale: 1-10; mild, moderate, severe)     Pressure  4. CAUSE: "What do you think is causing the symptoms?"     Possible UTI  5. OTHER SYMPTOMS: "Do you have any other symptoms?" (e.g., blood in urine, fever, flank pain, pain with urination)     Lower abdominal pain  Protocols used: Urinary Symptoms-A-AH

## 2023-07-25 NOTE — Telephone Encounter (Signed)
 Spoke with pt and she states she drank a whole lot of water yesterday and she is feeling better. She has appt tomorrow and will address if she sx start back.

## 2023-07-26 ENCOUNTER — Ambulatory Visit (INDEPENDENT_AMBULATORY_CARE_PROVIDER_SITE_OTHER): Payer: BC Managed Care – PPO | Admitting: Family Medicine

## 2023-07-26 ENCOUNTER — Encounter: Payer: Self-pay | Admitting: Family Medicine

## 2023-07-26 VITALS — BP 110/78 | HR 65 | Temp 98.4°F | Ht 67.0 in | Wt 276.8 lb

## 2023-07-26 DIAGNOSIS — Z0000 Encounter for general adult medical examination without abnormal findings: Secondary | ICD-10-CM | POA: Diagnosis not present

## 2023-07-26 DIAGNOSIS — R350 Frequency of micturition: Secondary | ICD-10-CM

## 2023-07-26 DIAGNOSIS — E559 Vitamin D deficiency, unspecified: Secondary | ICD-10-CM | POA: Diagnosis not present

## 2023-07-26 LAB — TSH: TSH: 1.97 u[IU]/mL (ref 0.35–5.50)

## 2023-07-26 LAB — CBC WITH DIFFERENTIAL/PLATELET
Basophils Absolute: 0 10*3/uL (ref 0.0–0.1)
Basophils Relative: 0.5 % (ref 0.0–3.0)
Eosinophils Absolute: 0.1 10*3/uL (ref 0.0–0.7)
Eosinophils Relative: 1.5 % (ref 0.0–5.0)
HCT: 38.2 % (ref 36.0–46.0)
Hemoglobin: 13 g/dL (ref 12.0–15.0)
Lymphocytes Relative: 26.3 % (ref 12.0–46.0)
Lymphs Abs: 1.4 10*3/uL (ref 0.7–4.0)
MCHC: 34 g/dL (ref 30.0–36.0)
MCV: 90.6 fl (ref 78.0–100.0)
Monocytes Absolute: 0.5 10*3/uL (ref 0.1–1.0)
Monocytes Relative: 10.2 % (ref 3.0–12.0)
Neutro Abs: 3.3 10*3/uL (ref 1.4–7.7)
Neutrophils Relative %: 61.5 % (ref 43.0–77.0)
Platelets: 300 10*3/uL (ref 150.0–400.0)
RBC: 4.22 Mil/uL (ref 3.87–5.11)
RDW: 14 % (ref 11.5–15.5)
WBC: 5.3 10*3/uL (ref 4.0–10.5)

## 2023-07-26 LAB — POCT URINALYSIS DIPSTICK
Bilirubin, UA: NEGATIVE
Blood, UA: NEGATIVE
Glucose, UA: NEGATIVE
Ketones, UA: NEGATIVE
Leukocytes, UA: NEGATIVE
Nitrite, UA: NEGATIVE
Protein, UA: NEGATIVE
Spec Grav, UA: 1.015 (ref 1.010–1.025)
Urobilinogen, UA: 0.2 U/dL
pH, UA: 5.5 (ref 5.0–8.0)

## 2023-07-26 LAB — HEPATIC FUNCTION PANEL
ALT: 15 U/L (ref 0–35)
AST: 18 U/L (ref 0–37)
Albumin: 4.4 g/dL (ref 3.5–5.2)
Alkaline Phosphatase: 108 U/L (ref 39–117)
Bilirubin, Direct: 0.1 mg/dL (ref 0.0–0.3)
Total Bilirubin: 0.6 mg/dL (ref 0.2–1.2)
Total Protein: 7.3 g/dL (ref 6.0–8.3)

## 2023-07-26 LAB — LIPID PANEL
Cholesterol: 212 mg/dL — ABNORMAL HIGH (ref 0–200)
HDL: 61 mg/dL (ref >39.00)
LDL Cholesterol: 108 mg/dL — ABNORMAL HIGH (ref 0–99)
NonHDL: 150.9
Total CHOL/HDL Ratio: 3
Triglycerides: 215 mg/dL — ABNORMAL HIGH (ref 0.0–149.0)
VLDL: 43 mg/dL — ABNORMAL HIGH (ref 0.0–40.0)

## 2023-07-26 LAB — BASIC METABOLIC PANEL WITH GFR
BUN: 13 mg/dL (ref 6–23)
CO2: 28 meq/L (ref 19–32)
Calcium: 9.8 mg/dL (ref 8.4–10.5)
Chloride: 103 meq/L (ref 96–112)
Creatinine, Ser: 0.86 mg/dL (ref 0.40–1.20)
GFR: 72.82 mL/min (ref >60.00)
Glucose, Bld: 95 mg/dL (ref 70–99)
Potassium: 4.6 meq/L (ref 3.5–5.1)
Sodium: 137 meq/L (ref 135–145)

## 2023-07-26 LAB — HEMOGLOBIN A1C: Hgb A1c MFr Bld: 5.4 % (ref 4.6–6.5)

## 2023-07-26 LAB — VITAMIN D 25 HYDROXY (VIT D DEFICIENCY, FRACTURES): VITD: 33.61 ng/mL (ref 30.00–100.00)

## 2023-07-26 NOTE — Assessment & Plan Note (Signed)
 Ongoing issue for pt.  Stressed need for healthy diet and regular exercise.  She is not interested in an injectable GLP1 at this time but may see what [facility] has to say.  Check labs to risk stratify.  Will follow.

## 2023-07-26 NOTE — Progress Notes (Signed)
   Subjective:    Patient ID: Patricia Adams, female    DOB: 24-Dec-1960, 63 y.o.   MRN: [id number]  HPI CPE- UTD on colonoscopy, pap, Tdap, mammo (Dr Henderson Cloud)  Patient Care Team    Relationship Specialty Notifications Start End  Sheliah Hatch, MD PCP - General Family Medicine  04/20/15   Carrington Clamp, MD Consulting Physician Obstetrics and Gynecology  04/20/15   Charna Elizabeth, MD Consulting Physician Gastroenterology  04/20/15     Health Maintenance  Topic Date Due   Pneumococcal Vaccine 38-6 Years old (1 of 2 - PCV) Never done   MAMMOGRAM  06/08/2023   INFLUENZA VACCINE  11/23/2023   Colonoscopy  01/22/2025   DTaP/Tdap/Td (2 - Td or Tdap) 04/05/2025   Cervical Cancer Screening (HPV/Pap Cotest)  06/27/2025   Hepatitis C Screening  Completed   HIV Screening  Completed   Zoster Vaccines- Shingrix  Completed   HPV VACCINES  Aged Out   COVID-19 Vaccine  Discontinued      Review of Systems Patient reports no vision/ hearing changes, adenopathy,fever, weight change,  persistant/recurrent hoarseness , swallowing issues, chest pain, palpitations, edema, persistant/recurrent cough, hemoptysis, dyspnea (rest/exertional/paroxysmal nocturnal), gastrointestinal bleeding (melena, rectal bleeding), abdominal pain, significant heartburn, bowel changes, Gyn symptoms (abnormal  bleeding, pain),  syncope, focal weakness, memory loss, numbness & tingling, skin/hair/nail changes, abnormal bruising or bleeding, anxiety, or depression.   Dysuria- sxs started Tuesday.  Increased frequency, pressure, some pink tinge when wiping    Objective:   Physical Exam General Appearance:    Alert, cooperative, no distress, appears stated age  Head:    Normocephalic, without obvious abnormality, atraumatic  Eyes:    PERRL, conjunctiva/corneas clear, EOM's intact both eyes  Ears:    Normal TM's and external ear canals, both ears  Nose:   Nares normal, septum midline, mucosa normal, no drainage    or  sinus tenderness  Throat:   Lips, mucosa, and tongue normal; teeth and gums normal  Neck:   Supple, symmetrical, trachea midline, no adenopathy;    Thyroid: no enlargement/tenderness/nodules  Back:     Symmetric, no curvature, ROM normal, no CVA tenderness  Lungs:     Clear to auscultation bilaterally, respirations unlabored  Chest Wall:    No tenderness or deformity   Heart:    Regular rate and rhythm, S1 and S2 normal, I-II/VI murmur, no rub or gallop  Breast Exam:    Deferred to GYN  Abdomen:     Soft, non-tender, bowel sounds active all four quadrants,    no masses, no organomegaly  Genitalia:    Deferred to GYN  Rectal:    Extremities:   Extremities normal, atraumatic, no cyanosis or edema  Pulses:   2+ and symmetric all extremities  Skin:   Skin color, texture, turgor normal, no rashes or lesions  Lymph nodes:   Cervical, supraclavicular, and axillary nodes normal  Neurologic:   CNII-XII intact, normal strength, sensation and reflexes    throughout          Assessment & Plan:

## 2023-07-26 NOTE — Assessment & Plan Note (Signed)
 Pt's PE WNL w/ exception BMI and known SEM.  UTD on colonoscopy, pap, mammo, Tdap.  Check labs.  Anticipatory guidance provided.

## 2023-07-26 NOTE — Patient Instructions (Addendum)
Follow up in 1 year or as needed We'll notify you of your lab results and make any changes if needed Continue to work on healthy diet and regular exercise- you can do it! Call with any questions or concerns Stay Safe!  Stay Healthy! Happy Spring!!! 

## 2023-07-27 ENCOUNTER — Encounter: Payer: Self-pay | Admitting: Family Medicine

## 2023-07-27 NOTE — Telephone Encounter (Signed)
-----   Message from Neena Rhymes sent at 07/27/2023  7:49 AM EDT ----- Labs are stable and look good!  No changes at this time

## 2023-07-27 NOTE — Telephone Encounter (Signed)
 Lab results have been discussed.   Verbalized understanding? Yes  Are there any questions?  Patient was asking about levothyroxine medication and wondering if she might need a refill but I did inform patient from her chart it looks like a refill was placed 06/12/2023 for 90 tab and 1 refills which equals 6 months but to call her pharmacy to double check and reach out to [place] if she needs further refills. Patient verbalized understanding.

## 2023-07-29 ENCOUNTER — Ambulatory Visit
Admission: RE | Admit: 2023-07-29 | Discharge: 2023-07-29 | Disposition: A | Discharge status: Home / Self Care | Source: Ambulatory Visit | Attending: Nurse Practitioner | Admitting: Nurse Practitioner

## 2023-07-29 ENCOUNTER — Other Ambulatory Visit: Payer: Self-pay

## 2023-07-29 VITALS — BP 115/81 | HR 63 | Temp 97.7°F | Resp 18

## 2023-07-29 DIAGNOSIS — B9620 Unspecified Escherichia coli [E. coli] as the cause of diseases classified elsewhere: Secondary | ICD-10-CM

## 2023-07-29 DIAGNOSIS — N390 Urinary tract infection, site not specified: Secondary | ICD-10-CM | POA: Diagnosis not present

## 2023-07-29 DIAGNOSIS — R300 Dysuria: Secondary | ICD-10-CM | POA: Diagnosis not present

## 2023-07-29 LAB — URINE CULTURE
MICRO NUMBER:: 16285371
SPECIMEN QUALITY:: ADEQUATE

## 2023-07-29 LAB — POCT URINALYSIS DIP (MANUAL ENTRY)
Bilirubin, UA: NEGATIVE
Glucose, UA: NEGATIVE mg/dL
Ketones, POC UA: NEGATIVE mg/dL
Nitrite, UA: NEGATIVE
Protein Ur, POC: NEGATIVE mg/dL
Spec Grav, UA: 1.015 (ref 1.010–1.025)
Urobilinogen, UA: 0.2 U/dL
pH, UA: 5.5 (ref 5.0–8.0)

## 2023-07-29 MED ORDER — PHENAZOPYRIDINE HCL 200 MG PO TABS: 200.0000 mg | ORAL_TABLET | Freq: Three times a day (TID) | ORAL | 0 refills | Status: AC

## 2023-07-29 MED ORDER — NITROFURANTOIN MONOHYD MACRO 100 MG PO CAPS: 100.0000 mg | ORAL_CAPSULE | Freq: Two times a day (BID) | ORAL | 0 refills | Status: DC

## 2023-07-29 NOTE — ED Provider Notes (Signed)
 [facility]    CSN: [id number] Arrival date & time: 07/29/23  1019      History   Chief Complaint Chief Complaint  Patient presents with   Urinary Frequency    Pain during urination. Pink tinted urine. Pressure and Bloated feeling. - Entered by patient   Dysuria    HPI Patricia Adams is a 63 y.o. female.   Dysuria  Urinary frequency  Pressure  Odor to urine, strong smelling X 5 days  Increased water intake  Saw PCP for physical on Thurs - UA negative for UTI, cx pending   Patient took tagement which she was previously told could help "calm down" with UTI sx   UA in office unremarkable. No treatment waiting on Ucx.   Ucx resulted this AM + E. Coli sensitive to Macrobid   Past Medical History:  Diagnosis Date   Arthritis    Asthma    Situational asthma   Basal cell carcinoma of face 06/09/2021   Headache    History of chicken pox    Hypothyroid 09/09/2020   Neuromuscular disorder (HCC)    Physical exam 05/05/2015   Systolic murmur 06/12/2022    Patient Active Problem List   Diagnosis Date Noted   Systolic murmur 06/12/2022   Basal cell carcinoma of face 06/09/2021   History of malignant neoplasm of skin 06/09/2021   Rosacea 06/09/2021   Hypothyroid 09/09/2020   Family history of early CAD 06/04/2017   Vitamin D deficiency 11/30/2016   Physical exam 05/05/2015   Morbid obesity (HCC) 05/05/2015   Arthritis of knee, degenerative 05/05/2015   Pain and swelling of right lower leg 04/20/2015   Trapezius muscle spasm 04/20/2015   Right elbow pain 04/20/2015    History reviewed. No pertinent surgical history.  OB History   No obstetric history on file.      Home Medications    Prior to Admission medications   Medication Sig Start Date End Date Taking? Authorizing Provider  levothyroxine (SYNTHROID) 50 MCG tablet Take 1 tablet (50 mcg total) by mouth daily. 06/12/23  Yes Sheliah Hatch, MD  nitrofurantoin, macrocrystal-monohydrate,  (MACROBID) 100 MG capsule Take 1 capsule (100 mg total) by mouth 2 (two) times daily. 07/29/23  Yes Lurline Idol, FNP  phenazopyridine (PYRIDIUM) 200 MG tablet Take 1 tablet (200 mg total) by mouth 3 (three) times daily. 07/29/23  Yes Lurline Idol, FNP  Vitamin D, Ergocalciferol, (DRISDOL) 1.25 MG (50000 UNIT) CAPS capsule Take 1 capsule (50,000 Units total) by mouth every 7 (seven) days. 12/18/19  Yes Sheliah Hatch, MD  BIOTIN PO Take by mouth.    [provider]  Calcium Carbonate-Vitamin D (CALCIUM-D PO) Take 1 tablet by mouth daily.    [provider]  Multiple Vitamin (MULTIVITAMIN ADULT PO) Take by mouth.    [provider]  Omega-3 Fatty Acids (FISH OIL OMEGA-3 PO) Take by mouth.    [provider]    Family History Family History  Problem Relation Age of Onset   Alcohol abuse Mother    Heart disease Mother    Stroke Mother    Varicose Veins Mother    Early death Mother    Heart disease Father    COPD Father    Diabetes Paternal Aunt    Aneurysm Maternal Grandmother        Brain   Diabetes Paternal Grandfather     Social History Social History   Tobacco Use   Smoking status: Former  Smokeless tobacco: Never  Vaping Use   Vaping status: Never Used  Substance Use Topics   Alcohol use: No   Drug use: No     Allergies   Patient has no known allergies.   Review of Systems Review of Systems   Physical Exam Triage Vital Signs ED Triage Vitals  Encounter Vitals Group     BP 07/29/23 1038 115/81     Systolic BP Percentile --      Diastolic BP Percentile --      Pulse Rate 07/29/23 1038 63     Resp 07/29/23 1038 18     Temp 07/29/23 1038 97.7 F (36.5 C)     Temp src --      SpO2 07/29/23 1038 98 %     Weight --      Height --      Head Circumference --      Peak Flow --      Pain Score 07/29/23 1036 2     Pain Loc --      Pain Education --      Exclude from Growth Chart --    No data found.  Updated  Vital Signs BP 115/81   Pulse 63   Temp 97.7 F (36.5 C)   Resp 18   LMP 10/20/2011   SpO2 98%   Visual Acuity Right Eye Distance:   Left Eye Distance:   Bilateral Distance:    Right Eye Near:   Left Eye Near:    Bilateral Near:     Physical Exam   UC Treatments / Results  Labs (all labs ordered are listed, but only abnormal results are displayed) Labs Reviewed  POCT URINALYSIS DIP (MANUAL ENTRY) - Abnormal; Notable for the following components:      Result Value   Color, UA light yellow (*)    Blood, UA trace-intact (*)    Leukocytes, UA Small (1+) (*)    All other components within normal limits    EKG   Radiology No results found.  Procedures Procedures (including critical care time)  Medications Ordered in UC Medications - No data to display  Initial Impression / Assessment and Plan / UC Course  I have reviewed the triage vital signs and the nursing notes.  Pertinent labs & imaging results that were available during my care of the patient were reviewed by me and considered in my medical decision making (see chart for details).     *** Final Clinical Impressions(s) / UC Diagnoses   Final diagnoses:  Dysuria  E. coli UTI (urinary tract infection)   Discharge Instructions   None    ED Prescriptions     Medication Sig Dispense Auth. Provider   nitrofurantoin, macrocrystal-monohydrate, (MACROBID) 100 MG capsule Take 1 capsule (100 mg total) by mouth 2 (two) times daily. 10 capsule Lurline Idol, FNP   phenazopyridine (PYRIDIUM) 200 MG tablet Take 1 tablet (200 mg total) by mouth 3 (three) times daily. 6 tablet Lurline Idol, FNP      PDMP not reviewed this encounter.

## 2023-07-29 NOTE — ED Triage Notes (Signed)
 Pt reports dysuria and frequency since Tuesday. Pt has been to PCP with a neg dip stick.

## 2023-07-29 NOTE — Discharge Instructions (Addendum)
 The urine cultures that was sent by your primary care provider on 07/26/23 has come back positive for E. coli, which is a common type of bacteria that causes urinary tract infections (UTIs). This means that the symptoms you've been experiencing are due to a bacterial infection in your urinary system. It is important that you take all of your medications exactly as prescribed, even if you start to feel better before the medication is finished. Make sure to drink plenty of fluids throughout the day. This helps flush out your urinary system and keeps your urine clear or light yellow, which is a sign of good hydration. You should begin to feel better within the next couple of days. If your symptoms do not improve or if they get worse, please follow up with your primary care provider for further evaluation.

## 2023-07-30 ENCOUNTER — Telehealth: Payer: Self-pay | Admitting: Family Medicine

## 2023-07-30 NOTE — Telephone Encounter (Signed)
 Lab results have been discussed.   Verbalized understanding? Yes  Are there any questions? No    Patient stated she was seen in ED on 07/29/2023 and was prescribed different antibiotics for this UTI. Would you like her to stay on the antibiotic ED prescribed?

## 2023-07-30 NOTE — Telephone Encounter (Signed)
 FYI pt was seen in ED for dysuria yesterday

## 2023-07-30 NOTE — Telephone Encounter (Signed)
-----   Message from Neena Rhymes sent at 07/30/2023  7:35 AM EDT ----- Despite normal UA in office, urine culture shows infection.  Start Keflex 500mg  twice daily x5 days (#10, no refills) to treat UTI

## 2023-07-30 NOTE — Telephone Encounter (Signed)
 Patient Name First: Patricia Last: Adams Gender: Female DOB: 1961/02/15 Age: 63 Y 7 M 10 D Return Phone Number: [phone] (Primary) Address: City/ State/ Zip: [place]  [address] Client [facility] Night - C Client Site [facility] - Night Provider Lezlie Octave- MD Contact Type Call Who Is Calling Patient / Member / Family / Caregiver Call Type Triage / Clinical Relationship To Patient Self Return Phone Number [phone] (Primary) Chief Complaint Urine, Blood In Reason for Call Symptomatic / Request for Health Information Initial Comment Caller states saw Dr Beverely Low Thursday for UTI and check up; blood in urine; no lab results in my chart for culture; GOTO Facility Not Listed [facility] Translation No Nurse Assessment Nurse: Jana Half, RN, Eber Jones Date/Time (Eastern Time): 07/28/2023 2:12:37 PM Confirm and document reason for call. If symptomatic, describe symptoms. ---Caller states that she has blood in urine and burning during urination since Tuesday. Pt c/o pressure in bladder area. Denies fever. Saw provider on Thursday, but symptoms have worsened since then. Culture was done but does not have results. Does the patient have any new or worsening symptoms? ---Yes Will a triage be completed? ---Yes Related visit to physician within the last 2 weeks? ---Yes Does the PT have any chronic conditions? (i.e. diabetes, asthma, this includes High risk factors for pregnancy, etc.) ---Yes List chronic conditions. ---Thyroid Is this a behavioral health or substance abuse call? ---No Guidelines Guideline Title Affirmed Question Affirmed Notes Nurse Date/Time (Eastern Time) Urine - Blood In Pain or burning with passing urine Imrich, RN, Eber Jones 07/28/2023 2:17:15 PM PLEASE NOTE: All timestamps contained within this report are represented as [place] Standard Time. CONFIDENTIALTY  NOTICE: This fax transmission is intended only for the addressee. It contains information that is legally privileged, confidential or otherwise protected from use or disclosure. If you are not the intended recipient, you are strictly prohibited from reviewing, disclosing, copying using or disseminating any of this information or taking any action in reliance on or regarding this information. If you have received this fax in error, please notify us immediately by telephone so that we can arrange for its return to [place]. Phone: [phone], Toll-Free: [phone], Fax: [phone] [email or web address]_[email or web address] January 23, 1961 Page: 2 of 2 CallId: [id number] Disp. Time Lamount Cohen Time) Disposition Final User 07/28/2023 2:20:27 PM See PCP within 24 Hours Yes Imrich, RN, Eber Jones Final Disposition 07/28/2023 2:20:27 PM See PCP within 24 Hours Yes Imrich, RN, Karn Pickler Disagree/Comply Comply Caller Understands Yes PreDisposition Go to Urgent Care/Walk-In Clinic Care Advice Given Per Guideline SEE PCP WITHIN 24 HOURS: * IF OFFICE WILL BE CLOSED: You need to be seen within the next 24 hours. A clinic or an urgent care center is often a good source of care if your doctor's office is closed or you can't get an appointment. DRINK EXTRA FLUIDS: * Drink extra fluids. * Drink 8 to 10 cups (1,800 to 2,400 ml) of liquids a day. * Reason: This will water-down your urine and make it less painful to pass. It will also help wash out any germs that may be in your bladder. PAIN MEDICINES: * For pain relief, you can take either acetaminophen, ibuprofen, or naproxen. CALL BACK IF: * Fever occurs * You become worse CARE ADVICE given per Urine - Blood In (Adult) guideline. Referrals GO TO FACILITY OTHER - SPECIFY

## 2023-09-04 ENCOUNTER — Encounter: Payer: Self-pay | Admitting: Student in an Organized Health Care Education/Training Program

## 2023-09-04 ENCOUNTER — Ambulatory Visit (INDEPENDENT_AMBULATORY_CARE_PROVIDER_SITE_OTHER): Admitting: Student in an Organized Health Care Education/Training Program

## 2023-09-04 ENCOUNTER — Ambulatory Visit: Admitting: Family Medicine

## 2023-09-04 VITALS — BP 122/76 | HR 71 | Wt 278.0 lb

## 2023-09-04 DIAGNOSIS — R399 Unspecified symptoms and signs involving the genitourinary system: Secondary | ICD-10-CM | POA: Diagnosis not present

## 2023-09-04 DIAGNOSIS — R310 Gross hematuria: Secondary | ICD-10-CM | POA: Insufficient documentation

## 2023-09-04 DIAGNOSIS — N3000 Acute cystitis without hematuria: Secondary | ICD-10-CM | POA: Insufficient documentation

## 2023-09-04 DIAGNOSIS — N3001 Acute cystitis with hematuria: Secondary | ICD-10-CM | POA: Diagnosis not present

## 2023-09-04 LAB — POCT URINALYSIS DIPSTICK
Bilirubin, UA: NEGATIVE
Glucose, UA: NEGATIVE
Ketones, UA: NEGATIVE
Nitrite, UA: NEGATIVE
Protein, UA: NEGATIVE
Spec Grav, UA: 1.01 (ref 1.010–1.025)
Urobilinogen, UA: 0.2 U/dL
pH, UA: 6 (ref 5.0–8.0)

## 2023-09-04 MED ORDER — NITROFURANTOIN MONOHYD MACRO 100 MG PO CAPS: 100.0000 mg | ORAL_CAPSULE | Freq: Two times a day (BID) | ORAL | 0 refills | Status: DC

## 2023-09-04 NOTE — Progress Notes (Signed)
 Acute Office Visit  Subjective:     Patient ID: Patricia Adams, female    DOB: [birth year], 63 y.o.   MRN: [id number]  Chief Complaint  Patient presents with   Hematuria    Blood in urine last night, pressure and pain yesterday. Patient states this has happened before.     HPI  Patient is in today for concerns of a urinary tract infection.  Since Sunday she reports feeling dysuria, frequency, and urgency with urination.  Also felt a sensation of heaviness in her lower abdomen.  Arnetta Lank it felt like she was pregnant again.  Last night she started to notice hematuria with dark pink-colored urine.  She denies any fevers or chills.  No systemic symptoms.  Eating and drinking well.  No nausea or vomiting.  She had similar symptoms back in April, was treated for 5 days with an antibiotic and had good resolution of those symptoms.  She had similar issues in 2021 with recurrent urinary tract infections and hematuria.  At that time she was evaluated at [facility] urology.  Underwent CT and cystoscopy.  Workup was reassuring.  The symptoms resolved and she had no issues for about 4 years.  But now she is concerned that they are starting back up since April.      Objective:     BP 122/76   Pulse 71   Wt 278 lb (126.1 kg)   LMP 10/20/2011   SpO2 98%   BMI 43.54 kg/m    Physical Exam  Gen: Well-appearing woman Heart: Regular, 3 out of 6 early systolic murmur best heard at the right upper sternal border Lungs: Unlabored, clear throughout Abd: Soft, nondistended, nontender, mild discomfort elicited with palpation over the bladder  Results for orders placed or performed in visit on 09/04/23  POCT Urinalysis Dipstick  Result Value Ref Range   Color, UA yellow    Clarity, UA cloudy    Glucose, UA Negative Negative   Bilirubin, UA negative    Ketones, UA negative    Spec Grav, UA 1.010 1.010 - 1.025   Blood, UA 3+    pH, UA 6.0 5.0 - 8.0   Protein, UA Negative Negative   Urobilinogen,  UA 0.2 0.2 or 1.0 E.U./dL   Nitrite, UA negative    Leukocytes, UA Large (3+) (A) Negative   Appearance     Odor          Assessment & Plan:   Problem List Items Addressed This Visit       Unprioritized   Acute cystitis - Primary   Symptoms seem like they could be consistent with a mild acute cystitis.  She has some mild dysuria, increased frequency and urgency, urinalysis has pyuria and hematuria.  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  Doing fine for 4 years and now has had 2 recurrent episodes of cystitis and hematuria in the last 1 month.  Will send urine for culture, will treat with Macrobid , last culture was pansensitive E. coli.  If the cystitis becomes a recurrent issue, may want to start vaginal estrogen.      Relevant Medications   nitrofurantoin , macrocrystal-monohydrate, (MACROBID ) 100 MG capsule   Other Relevant Orders   Urine Culture   Urinalysis, Routine w reflex microscopic   Gross hematuria   Recurrent issue of gross hematuria in the setting of possible infectious cystitis.  I am going  to treat with antibiotics, and ask her to follow-up in 4 weeks to ensure that the hematuria resolves.  This was previously evaluated by Dr. Annitta Kindler at [facility] urology.  She has had a CT and cystoscopy in 2021 which was reassuring.  She does have a tobacco use history.  I think if the hematuria is a recurrent issue, or persistent after the cystitis is treated, we could refer her back to urology to consider a repeat cystoscopy.  This might be especially helpful if it is when she is having active bleeding symptoms.      Other Visit Diagnoses       UTI symptoms       Relevant Orders   POCT Urinalysis Dipstick (Completed)       Meds ordered this encounter  Medications   nitrofurantoin , macrocrystal-monohydrate, (MACROBID ) 100 MG capsule    Sig: Take 1 capsule (100 mg  total) by mouth 2 (two) times daily.    Dispense:  10 capsule    Refill:  0    Return in about 4 weeks (around 10/02/2023).  Ether Hercules, MD

## 2023-09-04 NOTE — Assessment & Plan Note (Signed)
 Recurrent issue of gross hematuria in the setting of possible infectious cystitis.  I am going to treat with antibiotics, and ask her to follow-up in 4 weeks to ensure that the hematuria resolves.  This was previously evaluated by Dr. Annitta Kindler at [facility] urology.  She has had a CT and cystoscopy in 2021 which was reassuring.  She does have a tobacco use history.  I think if the hematuria is a recurrent issue, or persistent after the cystitis is treated, we could refer her back to urology to consider a repeat cystoscopy.  This might be especially helpful if it is when she is having active bleeding symptoms.

## 2023-09-04 NOTE — Assessment & Plan Note (Signed)
 Symptoms seem like they could be consistent with a mild acute cystitis.  She has some mild dysuria, increased frequency and urgency, urinalysis has pyuria and hematuria.  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  Doing fine for 4 years and now has had 2 recurrent episodes of cystitis and hematuria in the last 1 month.  Will send urine for culture, will treat with Macrobid , last culture was pansensitive E. coli.  If the cystitis becomes a recurrent issue, may want to start vaginal estrogen.

## 2023-09-05 ENCOUNTER — Telehealth: Payer: Self-pay

## 2023-09-05 DIAGNOSIS — N3001 Acute cystitis with hematuria: Secondary | ICD-10-CM

## 2023-09-05 LAB — URINALYSIS, ROUTINE W REFLEX MICROSCOPIC
Bilirubin Urine: NEGATIVE
Ketones, ur: NEGATIVE
Nitrite: NEGATIVE
Specific Gravity, Urine: <=1.005 — AB (ref 1.000–1.030)
Total Protein, Urine: NEGATIVE
Urine Glucose: NEGATIVE
Urobilinogen, UA: 0.2 (ref 0.0–1.0)
pH: 6 (ref 5.0–8.0)

## 2023-09-05 MED ORDER — CEPHALEXIN 500 MG PO CAPS: 500.0000 mg | ORAL_CAPSULE | Freq: Two times a day (BID) | ORAL | 0 refills | Status: AC

## 2023-09-05 NOTE — Telephone Encounter (Signed)
 Copied from CRM [id number]. Topic: Clinical - Prescription Issue >> Sep 05, 2023  8:40 AM Orien Bird wrote: Reason for CRM: Patient called stating she had a reaction to the antibiotics she was giving and she had a reaction too and she is wondering if dr can send something else in.

## 2023-09-05 NOTE — Telephone Encounter (Signed)
 Patient states she took 2 pills yesterday at once of the prescribed antibiotic and started itching last night and seems like tiny pin pricks of skin raised up and skin felt dry all through out torso area.  Patient states the pin pricks of skin went down but still itching this morning and patient has stopped antibiotics.

## 2023-09-05 NOTE — Telephone Encounter (Signed)
 I spoke with the patient by phone. Unclear the cause of the pruritus. It happened after taking macrobid , a medicine she tolerated well in April. Doesn't sound like a classic Type 1 hypersensitivity reaction, she had no hives, just papules. Could be a type 4 drug induced exanthem. Will discontinue macrobid  and I have added it to her allergy list. Will switch to keflex for 5 days. Urine culture pending. She has no symptoms of anaphylaxis.

## 2023-09-06 ENCOUNTER — Ambulatory Visit: Payer: Self-pay | Admitting: Student in an Organized Health Care Education/Training Program

## 2023-09-06 LAB — URINE CULTURE
MICRO NUMBER:: 16448966
SPECIMEN QUALITY:: ADEQUATE

## 2023-10-03 ENCOUNTER — Ambulatory Visit: Admitting: Family Medicine

## 2023-10-03 ENCOUNTER — Telehealth: Payer: Self-pay

## 2023-10-03 ENCOUNTER — Encounter: Payer: Self-pay | Admitting: Family Medicine

## 2023-10-03 ENCOUNTER — Telehealth: Admitting: Family Medicine

## 2023-10-03 VITALS — Ht 67.0 in | Wt 278.0 lb

## 2023-10-03 DIAGNOSIS — N3001 Acute cystitis with hematuria: Secondary | ICD-10-CM

## 2023-10-03 NOTE — Progress Notes (Signed)
   Virtual Visit via Video   I connected with patient on 10/03/23 at  8:40 AM EDT by a video enabled telemedicine application and verified that I am speaking with the correct person using two identifiers.  Location patient: Home Location provider: [facility] Office Persons participating in the virtual visit: Patient, Provider, CMA (Gzermani M)  I discussed the limitations of evaluation and management by telemedicine and the availability of in person appointments. The patient expressed understanding and agreed to proceed.  Subjective:   HPI:   Cystitis- pt saw Dr Jerrell on 5/13 and was tx'd for UTI.  Culture was unremarkable but sxs resolved after 5 days of abx.  Pt reports she will randomly experience heaviness in her bladder.  No burning w/ urination, no visible blood.  ROS:   See pertinent positives and negatives per HPI.  Patient Active Problem List   Diagnosis Date Noted   Acute cystitis 09/04/2023   Gross hematuria 09/04/2023   Systolic murmur 06/12/2022   Basal cell carcinoma of face 06/09/2021   History of malignant neoplasm of skin 06/09/2021   Rosacea 06/09/2021   Hypothyroid 09/09/2020   Menopausal symptom 06/13/2017   Family history of early CAD 06/04/2017   Vitamin D  deficiency 11/30/2016   Physical exam 05/05/2015   Morbid obesity (HCC) 05/05/2015   Arthritis of knee, degenerative 05/05/2015   Pain and swelling of right lower leg 04/20/2015   Trapezius muscle spasm 04/20/2015   Right elbow pain 04/20/2015   Mild intermittent asthma without complication 09/28/2012   Tobacco use disorder 09/28/2012    Social History   Tobacco Use   Smoking status: Former   Smokeless tobacco: Never  Substance Use Topics   Alcohol use: No    Current Outpatient Medications:    BIOTIN PO, Take by mouth., Disp: , Rfl:    Calcium Carbonate-Vitamin D  (CALCIUM-D PO), Take 1 tablet by mouth daily., Disp: , Rfl:    levothyroxine  (SYNTHROID ) 50 MCG tablet, Take 1  tablet (50 mcg total) by mouth daily., Disp: 90 tablet, Rfl: 1   Multiple Vitamin (MULTIVITAMIN ADULT PO), Take by mouth., Disp: , Rfl:    Omega-3 Fatty Acids (FISH OIL OMEGA-3 PO), Take by mouth., Disp: , Rfl:    phenazopyridine  (PYRIDIUM ) 200 MG tablet, Take 1 tablet (200 mg total) by mouth 3 (three) times daily., Disp: 6 tablet, Rfl: 0   Vitamin D , Ergocalciferol , (DRISDOL ) 1.25 MG (50000 UNIT) CAPS capsule, Take 1 capsule (50,000 Units total) by mouth every 7 (seven) days., Disp: 12 capsule, Rfl: 0  Allergies  Allergen Reactions   Macrobid  [Nitrofurantoin ] Rash    Drug induced papular exanthem    Objective:   Ht 5' 7 (1.702 m)   Wt 278 lb (126.1 kg)   LMP 10/20/2011   BMI 43.54 kg/m  AAOx3, NAD NCAT, EOMI No obvious CN deficits Coloring WNL Pt is able to speak clearly, coherently without shortness of breath or increased work of breathing.  Thought process is linear.  Mood is appropriate.   Assessment and Plan:   Cystitis- new.  Pt reports sxs resolved w/ abx.  No longer having blood in urine or frequency.  At times will have 'heaviness' in bladder but this resolves spontaneously.  No further work up at this time, but she knows to return if sxs again develop.   Comer Greet, MD 10/03/2023

## 2023-10-03 NOTE — Telephone Encounter (Signed)
 Copied from CRM [id number]. Topic: Appointments - Appointment Cancel/Reschedule >> Oct 03, 2023  8:16 AM Allyne Areola wrote: Patient/patient representative is calling to cancel or reschedule an appointment. Refer to attachments for appointment information. Patient called and could not come into the office, she rescheduled to a virtual appointment.

## 2023-11-10 ENCOUNTER — Other Ambulatory Visit: Payer: Self-pay | Admitting: Family Medicine

## 2023-11-10 DIAGNOSIS — E039 Hypothyroidism, unspecified: Secondary | ICD-10-CM

## 2024-02-21 DIAGNOSIS — I517 Cardiomegaly: Secondary | ICD-10-CM | POA: Diagnosis not present

## 2024-02-21 DIAGNOSIS — R079 Chest pain, unspecified: Secondary | ICD-10-CM | POA: Diagnosis not present

## 2024-07-28 ENCOUNTER — Encounter: Admitting: Family Medicine
